# Patient Record
Sex: Female | Born: 1975 | Race: Black or African American | Hispanic: No | Marital: Single | Smoking: Current every day smoker
Health system: Southern US, Community
[De-identification: ages and names within clinical notes are randomized; demographics above are authoritative.]

## PROBLEM LIST (undated history)

## (undated) DIAGNOSIS — R0600 Dyspnea, unspecified: Secondary | ICD-10-CM

## (undated) DIAGNOSIS — R51 Headache: Principal | ICD-10-CM

## (undated) DIAGNOSIS — D649 Anemia, unspecified: Secondary | ICD-10-CM

## (undated) DIAGNOSIS — I499 Cardiac arrhythmia, unspecified: Secondary | ICD-10-CM

## (undated) DIAGNOSIS — M545 Low back pain, unspecified: Secondary | ICD-10-CM

## (undated) DIAGNOSIS — K589 Irritable bowel syndrome without diarrhea: Secondary | ICD-10-CM

## (undated) DIAGNOSIS — G43909 Migraine, unspecified, not intractable, without status migrainosus: Secondary | ICD-10-CM

## (undated) DIAGNOSIS — E7800 Pure hypercholesterolemia, unspecified: Secondary | ICD-10-CM

## (undated) DIAGNOSIS — F419 Anxiety disorder, unspecified: Secondary | ICD-10-CM

## (undated) DIAGNOSIS — K219 Gastro-esophageal reflux disease without esophagitis: Secondary | ICD-10-CM

## (undated) DIAGNOSIS — E039 Hypothyroidism, unspecified: Secondary | ICD-10-CM

## (undated) DIAGNOSIS — M4800 Spinal stenosis, site unspecified: Secondary | ICD-10-CM

## (undated) HISTORY — PX: ABDOMINAL HYSTERECTOMY: SHX81

## (undated) HISTORY — DX: Low back pain: M54.5

## (undated) HISTORY — DX: Anemia, unspecified: D64.9

## (undated) HISTORY — PX: DILATION AND CURETTAGE OF UTERUS: SHX78

## (undated) HISTORY — DX: Migraine, unspecified, not intractable, without status migrainosus: G43.909

## (undated) HISTORY — DX: Spinal stenosis, site unspecified: M48.00

## (undated) HISTORY — DX: Low back pain, unspecified: M54.50

## (undated) HISTORY — DX: Pure hypercholesterolemia, unspecified: E78.00

## (undated) HISTORY — PX: OTHER SURGICAL HISTORY: SHX169

## (undated) HISTORY — PX: HERNIA REPAIR: SHX51

## (undated) HISTORY — DX: Headache: R51

---

## 2004-06-19 HISTORY — PX: CYSTECTOMY: SUR359

## 2004-06-19 HISTORY — PX: PARTIAL HYSTERECTOMY: SHX80

## 2008-06-19 HISTORY — PX: ORIF ANKLE FRACTURE BIMALLEOLAR: SUR920

## 2015-12-13 ENCOUNTER — Encounter: Payer: Self-pay | Admitting: Neurology

## 2015-12-13 ENCOUNTER — Ambulatory Visit (INDEPENDENT_AMBULATORY_CARE_PROVIDER_SITE_OTHER): Payer: BLUE CROSS/BLUE SHIELD | Admitting: Neurology

## 2015-12-13 VITALS — BP 108/82 | HR 60 | Ht 67.0 in | Wt 180.0 lb

## 2015-12-13 DIAGNOSIS — R51 Headache: Secondary | ICD-10-CM

## 2015-12-13 DIAGNOSIS — G4486 Cervicogenic headache: Secondary | ICD-10-CM

## 2015-12-13 HISTORY — DX: Cervicogenic headache: G44.86

## 2015-12-13 MED ORDER — GABAPENTIN 100 MG PO CAPS
ORAL_CAPSULE | ORAL | Status: DC
Start: 2015-12-13 — End: 2016-01-11

## 2015-12-13 NOTE — Progress Notes (Signed)
Reason for visit: Headache  Referring physician: Dr. Leone Haven is a 40 y.o. female  History of present illness:  Cindy Lamb is a 40 year old right-handed black female with a history of onset of daily headaches that began in the beginning of May 2017. The patient had spontaneous onset of symptoms. Prior to the onset of the headache, the patient was having only an occasional headache every 2 months or so. The patient indicates that the headaches are primarily on the left side, but the patient has significant neck discomfort and pain into the intrascapular area on the left and some occasional numbness down the left arm to the elbow. The patient indicates that she may get increased numbness down the left arm if she turns her head to the left. The pain is worse when she turns her head to the left rather than turning to the right. She may have some occasional sharp pains on the right side the head. The headaches on the left are dull achy and present at all times, better in the morning and worse as the day goes on. The patient reports no weakness of the extremities, or difficulty with balance. The patient has had some slight change in bladder function in the last couple months with increased urgency and frequency. The patient has been set up for physical therapy, this has not yet been done. The patient was placed on a prednisone Dosepak with some benefit. The patient has not had MRI evaluation of the brain or cervical spine. She is sent to this office for an evaluation. She has been placed on soma, but she can only take 1 tablet daily because of the drowsy effects of the medication.  Past Medical History  Diagnosis Date  . Low back pain     2 bulging discs  . Anemia   . Migraines   . High cholesterol   . Spinal stenosis     Mild  . Depression   . Cervicogenic headache 12/13/2015    Past Surgical History  Procedure Laterality Date  . Partial hysterectomy  2006  . Orif ankle  fracture bimalleolar  2010  . Cystectomy  2006    Family History  Problem Relation Age of Onset  . Prostate cancer Maternal Grandfather   . Hypertension Maternal Grandfather   . Hypertension Maternal Grandmother   . High Cholesterol Maternal Grandfather   . High Cholesterol Maternal Grandmother     Social history:  reports that she has been smoking Cigarettes.  She has been smoking about 0.50 packs per day. She does not have any smokeless tobacco history on file. She reports that she drinks alcohol. She reports that she uses illicit drugs.  Medications:  Prior to Admission medications   Medication Sig Start Date End Date Taking? Authorizing Provider  acetaminophen (TYLENOL) 500 MG tablet Take 500 mg by mouth every 6 (six) hours as needed.   Yes Historical Provider, MD  carisoprodol (SOMA) 350 MG tablet Take 350 mg by mouth 3 times/day as needed-between meals & bedtime for muscle spasms. Reported on 12/13/2015    Historical Provider, MD  Cholecalciferol (VITAMIN D PO) Take by mouth daily.   Yes Historical Provider, MD  fluticasone (FLONASE) 50 MCG/ACT nasal spray Place into both nostrils daily as needed for allergies or rhinitis.   Yes Historical Provider, MD  ibuprofen (ADVIL,MOTRIN) 200 MG tablet Take 200 mg by mouth every 4 (four) hours as needed.   Yes Historical Provider, MD  Multiple  Vitamin (MULTIVITAMIN) tablet Take 1 tablet by mouth daily.   Yes Historical Provider, MD  valACYclovir (VALTREX) 500 MG tablet Take 500 mg by mouth as needed.   Yes Historical Provider, MD  varenicline (CHANTIX) 1 MG tablet Take 1 mg by mouth 2 (two) times daily.   Yes Historical Provider, MD     No Known Allergies  ROS:  Out of a complete 14 system review of symptoms, the patient complains only of the following symptoms, and all other reviewed systems are negative.  Chills, fatigue Itching Cough, snoring Easy bruising Increased thirst Joint pain, muscle cramps, aching  muscles Allergies Headache, numbness Nonenhanced sleep insomnia, sleepiness, snoring, leg jerking   Blood pressure 108/82, pulse 60, height 5\' 7"  (1.702 m), weight 180 lb (81.647 kg).  Physical Exam  General: The patient is alert and cooperative at the time of the examination.  Eyes: Pupils are equal, round, and reactive to light. Discs are flat bilaterally.  Neck: The neck is supple, no carotid bruits are noted.  Respiratory: The respiratory examination is clear.  Cardiovascular: The cardiovascular examination reveals a regular rate and rhythm, no obvious murmurs or rubs are noted.  Neuromuscular: The patient lacks about 25 of lateral rotation of the cervical spine to the left, 20 to the right.  Skin: Extremities are without significant edema.  Neurologic Exam  Mental status: The patient is alert and oriented x 3 at the time of the examination. The patient has apparent normal recent and remote memory, with an apparently normal attention span and concentration ability.  Cranial nerves: Facial symmetry is present. There is good sensation of the face to pinprick and soft touch bilaterally. The strength of the facial muscles and the muscles to head turning and shoulder shrug are normal bilaterally. Speech is well enunciated, no aphasia or dysarthria is noted. Extraocular movements are full. Visual fields are full. The tongue is midline, and the patient has symmetric elevation of the soft palate. No obvious hearing deficits are noted.  Motor: The motor testing reveals 5 over 5 strength of all 4 extremities. Good symmetric motor tone is noted throughout.  Sensory: Sensory testing is intact to pinprick, soft touch, vibration sensation, and position sense on all 4 extremities. No evidence of extinction is noted.  Coordination: Cerebellar testing reveals good finger-nose-finger and heel-to-shin bilaterally.  Gait and station: Gait is normal. Tandem gait is normal. Romberg is negative.  No drift is seen.  Reflexes: Deep tendon reflexes are symmetric and normal bilaterally. Toes are downgoing bilaterally.   Assessment/Plan:  1. Probable cervicogenic headache   2. 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 The patient will be having some physical therapy, I will add low-dose gabapentin. If the patient is not improving over the next several weeks, she is to contact her office and we will get MRI evaluation of the cervical spine. The patient will otherwise follow-up in 2 months. She will call for dosage adjustments of the gabapentin if she is tolerating the medication.  Jill Alexanders MD 12/13/2015 8:44 PM  [facility] [address] [place], [place] [address]  Phone [phone] Fax [phone]

## 2015-12-13 NOTE — Patient Instructions (Signed)
   Neurontin (gabapentin) may result in drowsiness, ankle swelling, gait instability, or possibly dizziness. Please contact our office if significant side effects occur with this medication.  

## 2016-01-11 ENCOUNTER — Telehealth: Payer: Self-pay | Admitting: Neurology

## 2016-01-11 DIAGNOSIS — M542 Cervicalgia: Secondary | ICD-10-CM

## 2016-01-11 MED ORDER — GABAPENTIN 300 MG PO CAPS
ORAL_CAPSULE | ORAL | Status: DC
Start: 2016-01-11 — End: 2016-02-04

## 2016-01-11 NOTE — Telephone Encounter (Signed)
Patient is calling and states that she is tolerating the gabapentin fine and it is helping with her pain. Physical therapy is helping some with the pain but she does not have many sessions left and it costs her $50 per session. Please call.

## 2016-01-11 NOTE — Telephone Encounter (Signed)
Returned pt call. Reports that she has increased gabapentin to 300 mg TID which she's tolerating well. However, says that she's still having headaches and has even had to miss work recently d/t HA. Did attend 3 PT sessions which helped some w/ neck pain but she's unable to continue PT d/t cost. Would like Dr. Jannifer Franklin' recommendations for further headache management.

## 2016-01-11 NOTE — Telephone Encounter (Signed)
I called the patient. The patient is improving some physical therapy but this is too expensive. She is on gabapentin taking 300 mg 3 times daily, will increase the dosing to 300 mg twice during the day, 600 mg at night. I will get MRI study on the cervical spine, most of her discomfort is in the neck.

## 2016-02-04 ENCOUNTER — Telehealth: Payer: Self-pay | Admitting: Neurology

## 2016-02-04 MED ORDER — GABAPENTIN 300 MG PO CAPS: ORAL_CAPSULE | ORAL | 5 refills | Status: DC

## 2016-02-04 NOTE — Telephone Encounter (Signed)
Patient requesting refill of gabapentin (NEURONTIN) 300 MG capsule Pharmacy: [facility] [address] [place] [id number]

## 2016-02-04 NOTE — Telephone Encounter (Signed)
Refills retailed as requested. 

## 2016-02-14 ENCOUNTER — Ambulatory Visit: Payer: BLUE CROSS/BLUE SHIELD | Admitting: Adult Health

## 2016-02-22 ENCOUNTER — Ambulatory Visit (INDEPENDENT_AMBULATORY_CARE_PROVIDER_SITE_OTHER): Payer: BLUE CROSS/BLUE SHIELD | Admitting: Adult Health

## 2016-02-22 ENCOUNTER — Encounter: Payer: Self-pay | Admitting: Adult Health

## 2016-02-22 VITALS — BP 99/75 | HR 71 | Ht 67.0 in | Wt 184.2 lb

## 2016-02-22 DIAGNOSIS — R51 Headache: Secondary | ICD-10-CM

## 2016-02-22 DIAGNOSIS — G4486 Cervicogenic headache: Secondary | ICD-10-CM

## 2016-02-22 NOTE — Progress Notes (Signed)
PATIENT: Cindy Lamb DOB: 12-Aug-1975  REASON FOR VISIT: follow up- cervicogenic headaches HISTORY FROM: patient  HISTORY OF PRESENT ILLNESS: Cindy Lamb is a 40 year old female with a history of headaches and neck pain. She returns today for follow-up. She was started on gabapentin 300 mg in the morning and at noon and 600 mg in the evening. She also underwent physical therapy. She reports that gabapentin has been beneficial for her headaches. She states that she went from having daily headaches to having only 2 episodes a week. She also felt physical therapy beneficial but was unable to afford this. She was scheduled for an MRI of the cervical spine however she has not had this. She states that financially she cannot afford this at this time. She states her headaches typically occur in the back of the head. She states on Sunday she had a sharp pain in the neck that radiated to the back of the head up to the frontal region. She states that it lasted only several seconds. Denies nausea or vomiting, photophobia and phonophobia. She reports that she is tolerating gabapentin well. Denies any drowsiness. She is also been taking Soma however she's not had this refilled. She returns today for an evaluation.  HISTORY 12/13/15: Cindy Lamb is a 40 year old right-handed black female with a history of onset of daily headaches that began in the beginning of May 2017. The patient had spontaneous onset of symptoms. Prior to the onset of the headache, the patient was having only an occasional headache every 2 months or so. The patient indicates that the headaches are primarily on the left side, but the patient has significant neck discomfort and pain into the intrascapular area on the left and some occasional numbness down the left arm to the elbow. The patient indicates that she may get increased numbness down the left arm if she turns her head to the left. The pain is worse when she turns her head to the left rather  than turning to the right. She may have some occasional sharp pains on the right side the head. The headaches on the left are dull achy and present at all times, better in the morning and worse as the day goes on. The patient reports no weakness of the extremities, or difficulty with balance. The patient has had some slight change in bladder function in the last couple months with increased urgency and frequency. The patient has been set up for physical therapy, this has not yet been done. The patient was placed on a prednisone Dosepak with some benefit. The patient has not had MRI evaluation of the brain or cervical spine. She is sent to this office for an evaluation. She has been placed on soma, but she can only take 1 tablet daily because of the drowsy effects of the medication.   REVIEW OF SYSTEMS: Out of a complete 14 system review of symptoms, the patient complains only of the following symptoms, and all other reviewed systems are negative.  Frequency of urination, urgency, joint pain, joint swelling, back pain, aching muscles, muscle cramps, neck pain, insomnia, snoring, blurred vision, eye discharge, eye itching, eye redness, bruise/bleed easily, headache, numbness  ALLERGIES: No Known Allergies  HOME MEDICATIONS: Outpatient Medications Prior to Visit  Medication Sig Dispense Refill  . acetaminophen (TYLENOL) 500 MG tablet Take 500 mg by mouth every 6 (six) hours as needed.    . carisoprodol (SOMA) 350 MG tablet Take 350 mg by mouth 3 times/day as needed-between meals &  bedtime for muscle spasms. Reported on 12/13/2015    . Cholecalciferol (VITAMIN D PO) Take by mouth daily.    . fluticasone (FLONASE) 50 MCG/ACT nasal spray Place into both nostrils daily as needed for allergies or rhinitis.    Marland Kitchen gabapentin (NEURONTIN) 300 MG capsule Take 1 cap (300 mg) in the morning, 2 caps (600 mg) at night 90 capsule 5  . Multiple Vitamin (MULTIVITAMIN) tablet Take 1 tablet by mouth daily.    .  valACYclovir (VALTREX) 500 MG tablet Take 500 mg by mouth as needed.    Marland Kitchen ibuprofen (ADVIL,MOTRIN) 200 MG tablet Take 200 mg by mouth every 4 (four) hours as needed.    . varenicline (CHANTIX) 1 MG tablet Take 1 mg by mouth 2 (two) times daily.     No facility-administered medications prior to visit.     PAST MEDICAL HISTORY: Past Medical History:  Diagnosis Date  . Anemia   . 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 Depression   . High cholesterol   . Low back pain    2 bulging discs  . Migraines   . Spinal stenosis    Mild    PAST SURGICAL HISTORY: Past Surgical History:  Procedure Laterality Date  . CYSTECTOMY  2006  . ORIF ANKLE FRACTURE BIMALLEOLAR  2010  . PARTIAL HYSTERECTOMY  2006    FAMILY HISTORY: Family History  Problem Relation Age of Onset  . Prostate cancer Maternal Grandfather   . Hypertension Maternal Grandfather   . Hypertension Maternal Grandmother   . High Cholesterol Maternal Grandfather   . High Cholesterol Maternal Grandmother     SOCIAL HISTORY: Social History   Social History  . Marital status: Single    Spouse name: N/A  . Number of children: 2  . Years of education: College   Occupational History  .      [employer] History Main Topics  . Smoking status: Current Every Day Smoker    Packs/day: 0.50    Types: Cigarettes  . Smokeless tobacco: Not on file  . Alcohol use 0.0 oz/week     Comment: 0-5 drinks per wk  . Drug use:      Comment: occ marijuana  . Sexual activity: Not on file   Other Topics Concern  . Not on file   Social History Narrative   Lives at home w/ her son   Right-handed   Occasional caffeine      PHYSICAL EXAM  Vitals:   02/22/16 0748  BP: 99/75  Pulse: 71  Weight: 184 lb 3.2 oz (83.6 kg)  Height: 5\' 7"  (1.702 m)   Body mass index is 28.85 kg/m.  Generalized: Well developed, in no acute distress   Neurological examination  Mentation: Alert oriented to time, place,  history taking. Follows all commands speech and language fluent Cranial nerve II-XII: Pupils were equal round reactive to light. Extraocular movements were full, visual field were full on confrontational test. Facial sensation and strength were normal. Uvula tongue midline. Head turning and shoulder shrug  were normal and symmetric. Motor: The motor testing reveals 5 over 5 strength of all 4 extremities. Good symmetric motor tone is noted throughout.  Sensory: Sensory testing is intact to soft touch on all 4 extremities. No evidence of extinction is noted.  Coordination: Cerebellar testing reveals good finger-nose-finger and heel-to-shin bilaterally.  Gait and station: Gait is normal. Tandem gait is normal. Romberg is negative. No drift is seen.  Reflexes: Deep tendon  reflexes are symmetric and normal bilaterally.   DIAGNOSTIC DATA (LABS, IMAGING, TESTING) - I reviewed patient records, labs, notes, testing and imaging myself where available.     ASSESSMENT AND PLAN 40 y.o. year old female  has a past medical history of Anemia; Cervicogenic headache (12/13/2015); Depression; High cholesterol; Low back pain; Migraines; and Spinal stenosis. here with:  1. Cervicogenic headaches  The patient's headaches have improved with physical therapy and gabapentin. She is now only having 2 episodes a week. I advised patient that she could try increasing gabapentin to 600 mg in the morning, 300 mg at noon and 600 mg in the evening. However I did warn him that increasing her morning dose may cause drowsiness. The patient states that she will try this first on a weekend and if she is unable to tolerate it she will resume her current dose. However she finds it beneficial she will let us know and we will change her prescription. She verbalized understanding. She will follow-up in 3 months or sooner if needed.   Ward Givens, MSN, NP-C 02/22/2016, 8:42 AM [facility] [address] [place], [place] [address] [phone]

## 2016-02-22 NOTE — Progress Notes (Signed)
I reviewed note and agree with plan.   Penni Bombard, MD [id number], 0000000 AM Certified in Neurology, Neurophysiology and Neuroimaging  [facility] [address], [place], [place] [address] [phone]

## 2016-02-22 NOTE — Patient Instructions (Signed)
Continue Gabapentin- can increase to 2 tablets in the morning, 1 tablet at noon and 2 tablets at night. Increasing the morning dose may cause drowsiness. If this is helpful we can call in a new prescription

## 2016-05-23 ENCOUNTER — Ambulatory Visit: Payer: BLUE CROSS/BLUE SHIELD | Admitting: Adult Health

## 2016-05-24 ENCOUNTER — Encounter: Payer: Self-pay | Admitting: Adult Health

## 2016-10-10 ENCOUNTER — Ambulatory Visit (HOSPITAL_COMMUNITY)
Admission: EM | Admit: 2016-10-10 | Discharge: 2016-10-10 | Disposition: A | Discharge status: Home / Self Care | Payer: BLUE CROSS/BLUE SHIELD | Attending: Internal Medicine | Admitting: Internal Medicine

## 2016-10-10 ENCOUNTER — Encounter (HOSPITAL_COMMUNITY): Payer: Self-pay | Admitting: Emergency Medicine

## 2016-10-10 DIAGNOSIS — G44209 Tension-type headache, unspecified, not intractable: Secondary | ICD-10-CM | POA: Diagnosis not present

## 2016-10-10 DIAGNOSIS — R110 Nausea: Secondary | ICD-10-CM | POA: Diagnosis not present

## 2016-10-10 HISTORY — DX: Irritable bowel syndrome, unspecified: K58.9

## 2016-10-10 MED ORDER — ONDANSETRON 4 MG PO TBDP
8.0000 mg | ORAL_TABLET | Freq: Once | ORAL | Status: AC
  Administered 2016-10-10: 8 mg via ORAL

## 2016-10-10 MED ORDER — ONDANSETRON 4 MG PO TBDP
ORAL_TABLET | ORAL | Status: AC
  Filled 2016-10-10: qty 2

## 2016-10-10 MED ORDER — DEXAMETHASONE SODIUM PHOSPHATE 10 MG/ML IJ SOLN
INTRAMUSCULAR | Status: AC
  Filled 2016-10-10: qty 1

## 2016-10-10 MED ORDER — KETOROLAC TROMETHAMINE 60 MG/2ML IM SOLN
60.0000 mg | Freq: Once | INTRAMUSCULAR | Status: AC
  Administered 2016-10-10: 60 mg via INTRAMUSCULAR

## 2016-10-10 MED ORDER — DEXAMETHASONE SODIUM PHOSPHATE 10 MG/ML IJ SOLN
10.0000 mg | Freq: Once | INTRAMUSCULAR | Status: AC
  Administered 2016-10-10: 10 mg via INTRAMUSCULAR

## 2016-10-10 MED ORDER — KETOROLAC TROMETHAMINE 60 MG/2ML IM SOLN
INTRAMUSCULAR | Status: AC
  Filled 2016-10-10: qty 2

## 2016-10-10 NOTE — ED Triage Notes (Addendum)
Pt has been suffering from right sided headaches that only last a few minutes for about two weeks.  She states she first noticed a "swooshing" sound in her right ear a few days before that.  She denies any pain, fullness, or hearing loss in the right ear.  She does report a knot behind the ear and in her right neck.  She also reports generalized body aches that started this weekend.  She is not aware of any fever.    Bilateral feet swelling, more in the right than the left.

## 2016-10-10 NOTE — Discharge Instructions (Signed)
We have treated your headache here today with Toradol, Decadron, and Zofran. Based on your signs and symptoms, I think it would be wise for your to call your neurologist and schedule a follow-up appointment as soon as possible. If it any time your symptoms worsen, in anyway, go to the emergency room as soon as possible for further evaluation.

## 2016-10-10 NOTE — ED Provider Notes (Signed)
CSN: [id number]     Arrival date & time 10/10/16  1219 History   First MD Initiated Contact with Patient 10/10/16 1321     Chief Complaint  Patient presents with  . Headache    right side   (Consider location/radiation/quality/duration/timing/severity/associated sxs/prior Treatment) 41 year old female presents to clinic for evaluation of a right-sided headache that is been ongoing for 2 weeks. She states the headache is been in intermittent waxing and waning, states the highest spin is 9 out of 10 currently is 4 out of 10. She has taken seat a medicine without relief. She does have a past history of migraines, however states these headaches are different than her past headaches. She denies a "thunderclap" denies fever, neck stiffness or neck pain, has no focal weakness, or other neurological symptoms.   The history is provided by the patient.  Headache  Pain location:  R temporal Quality:  Sharp Radiates to:  Does not radiate Severity currently:  4/10 Severity at highest:  9/10 Onset quality:  Gradual Duration:  2 weeks Timing:  Intermittent Progression:  Waxing and waning Chronicity:  New Similar to prior headaches: no   Context: not activity, not exposure to bright light, not caffeine, not eating, not stress, not loud noise and not straining   Relieved by:  Nothing Worsened by:  Activity Ineffective treatments:  Acetaminophen Associated symptoms: nausea   Associated symptoms: no abdominal pain, no blurred vision, no congestion, no cough, no dizziness, no ear pain, no eye pain, no facial pain, no fever, no focal weakness, no hearing loss, no loss of balance, no myalgias, no neck pain, no neck stiffness, no numbness, no paresthesias, no photophobia, no seizures, no sinus pressure, no syncope, no tingling, no visual change and no vomiting     Past Medical History:  Diagnosis Date  . Anemia   . 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 Depression   . High cholesterol   . IBS (irritable  bowel syndrome)   . Low back pain    2 bulging discs  . Migraines   . Spinal stenosis    Mild   Past Surgical History:  Procedure Laterality Date  . CYSTECTOMY  2006  . ORIF ANKLE FRACTURE BIMALLEOLAR  2010  . PARTIAL HYSTERECTOMY  2006   Family History  Problem Relation Age of Onset  . Prostate cancer Maternal Grandfather   . Hypertension Maternal Grandfather   . High Cholesterol Maternal Grandfather   . Hypertension Maternal Grandmother   . High Cholesterol Maternal Grandmother    Social History  Substance Use Topics  . Smoking status: Current Every Day Smoker    Packs/day: 0.25    Types: Cigarettes  . Smokeless tobacco: Never Used  . Alcohol use No     Comment: 0-5 drinks per wk   OB History    No data available     Review of Systems  Constitutional: Negative for appetite change, chills and fever.  HENT: Negative for congestion, ear pain, hearing loss and sinus pressure.   Eyes: Negative for blurred vision, photophobia and pain.  Respiratory: Negative for cough and wheezing.   Cardiovascular: Negative for chest pain, palpitations and syncope.  Gastrointestinal: Positive for nausea. Negative for abdominal pain and vomiting.  Genitourinary: Negative.   Musculoskeletal: Negative for myalgias, neck pain and neck stiffness.  Skin: Negative.   Neurological: Positive for headaches. Negative for dizziness, focal weakness, seizures, numbness, paresthesias and loss of balance.    Allergies  Patient has no known allergies.  Home  Medications   Prior to Admission medications   Medication Sig Start Date End Date Taking? Authorizing Provider  loperamide (IMODIUM) 2 MG capsule Take by mouth as needed for diarrhea or loose stools.   Yes Historical Provider, MD  ranitidine (ZANTAC) 150 MG tablet Take 150 mg by mouth 2 (two) times daily.   Yes Historical Provider, MD  Vitamin D, Ergocalciferol, (DRISDOL) 50000 units CAPS capsule Take 50,000 Units by mouth every 7 (seven) days.    Yes Historical Provider, MD  acetaminophen (TYLENOL) 500 MG tablet Take 500 mg by mouth every 6 (six) hours as needed.    Historical Provider, MD  carisoprodol (SOMA) 350 MG tablet Take 350 mg by mouth 3 times/day as needed-between meals & bedtime for muscle spasms. Reported on 12/13/2015    Historical Provider, MD  Cholecalciferol (VITAMIN D PO) Take by mouth daily.    Historical Provider, MD  fluticasone (FLONASE) 50 MCG/ACT nasal spray Place into both nostrils daily as needed for allergies or rhinitis.    Historical Provider, MD  gabapentin (NEURONTIN) 300 MG capsule Take 1 cap (300 mg) in the morning, 2 caps (600 mg) at night 02/04/16   Kathrynn Ducking, MD  Multiple Vitamin (MULTIVITAMIN) tablet Take 1 tablet by mouth daily.    Historical Provider, MD  valACYclovir (VALTREX) 500 MG tablet Take 500 mg by mouth as needed.    Historical Provider, MD   Meds Ordered and Administered this Visit   Medications  ketorolac (TORADOL) injection 60 mg (60 mg Intramuscular Given 10/10/16 1349)  dexamethasone (DECADRON) injection 10 mg (10 mg Intramuscular Given 10/10/16 1352)  ondansetron (ZOFRAN-ODT) disintegrating tablet 8 mg (8 mg Oral Given 10/10/16 1348)    BP 129/71 (BP Location: Right Arm)   Pulse 72   Temp 99.2 F (37.3 C) (Oral)   SpO2 98%  No data found.   Physical Exam  Constitutional: She is oriented to person, place, and time. She appears well-developed and well-nourished. No distress.  HENT:  Head: Normocephalic and atraumatic.  Right Ear: External ear normal.  Left Ear: External ear normal.  Nose: Nose normal.  Mouth/Throat: Oropharynx is clear and moist.  Eyes: Conjunctivae are normal. Pupils are equal, round, and reactive to light. Right eye exhibits no discharge. Left eye exhibits no discharge.  Neck: Normal range of motion. Neck supple. No JVD present.  Cardiovascular: Normal rate and regular rhythm.   Pulmonary/Chest: Effort normal and breath sounds normal.   Lymphadenopathy:    She has no cervical adenopathy.  Neurological: She is alert and oriented to person, place, and time. No cranial nerve deficit or sensory deficit. She exhibits normal muscle tone. Coordination normal.  Skin: Skin is warm and dry. Capillary refill takes less than 2 seconds. She is not diaphoretic. No erythema.  Psychiatric: She has a normal mood and affect. Her behavior is normal.  Nursing note and vitals reviewed.   Urgent Care Course     Procedures (including critical care time)  Labs Review Labs Reviewed - No data to display  Imaging Review No results found.     MDM   1. Acute non intractable tension-type headache     Given Toradol, dexamethasone, Zofran in clinic. Patient has a neurologist, strongly recommend following up with the neurologist for further evaluation of her symptoms since these are different than past headaches and given her age of 31. Strict follow-up guidelines were given to go to the emergency room at any point her headache worsens, or if she has any  neurological deficits.    Barnet Glasgow, NP 10/10/16 [phone]

## 2016-10-12 ENCOUNTER — Encounter: Payer: Self-pay | Admitting: Adult Health

## 2016-10-12 ENCOUNTER — Ambulatory Visit (INDEPENDENT_AMBULATORY_CARE_PROVIDER_SITE_OTHER): Payer: BLUE CROSS/BLUE SHIELD | Admitting: Adult Health

## 2016-10-12 VITALS — BP 121/81 | HR 53 | Resp 16 | Ht 67.0 in | Wt 173.5 lb

## 2016-10-12 DIAGNOSIS — R42 Dizziness and giddiness: Secondary | ICD-10-CM

## 2016-10-12 DIAGNOSIS — R51 Headache with orthostatic component, not elsewhere classified: Secondary | ICD-10-CM

## 2016-10-12 DIAGNOSIS — G43009 Migraine without aura, not intractable, without status migrainosus: Secondary | ICD-10-CM | POA: Diagnosis not present

## 2016-10-12 DIAGNOSIS — H93A1 Pulsatile tinnitus, right ear: Secondary | ICD-10-CM | POA: Diagnosis not present

## 2016-10-12 NOTE — Progress Notes (Addendum)
PATIENT: Cindy Lamb DOB: [birth year]  REASON FOR VISIT: follow up- cervicogenic headache HISTORY FROM: patient  HISTORY OF PRESENT ILLNESS: Cindy Lamb is a 41 year old female with a history of cervicogenic headaches. She reports that she was having increasing urinary frequency and drowsiness during the day therefore she stopped gabapentin. She states in December she had a sinus infection and was treated by her primary care with Toradol and Decadron. She states after this she did not have any additional headaches. She reports approximately 2 weeks ago she had a different type of headache. Reports that she was at work and began to have sharp stabbing pain on the right side of the head. She did have photophobia and phonophobia during the headache. Nausea and vomiting. She also had blurry vision.  Dizziness during headache. She states that these headaches lasted for approximately 2 weeks on and off all day long. She states that she has  had trouble with her concentration since the headaches. She also now has a whooshing sound in the right ear. This is more pronounced during the headache but is present even now that  her headache has resolved. She states that she did go to urgent care on Tuesday and was given Toradol and Decadron. Reports today her headache has resolved. She also reports that during these headaches she was having muscle aches and noticed that her right foot was swollen. She also noted that her headaches was worse when she laid on the right side. She returns today for an evaluation. HISTORY 02/22/16: Cindy Lamb is a 41 year old female with a history of headaches and neck pain. She returns today for follow-up. She was started on gabapentin 300 mg in the morning and at noon and 600 mg in the evening. She also underwent physical therapy. She reports that gabapentin has been beneficial for her headaches. She states that she went from having daily headaches to having only 2 episodes a week. She  also felt physical therapy beneficial but was unable to afford this. She was scheduled for an MRI of the cervical spine however she has not had this. She states that financially she cannot afford this at this time. She states her headaches typically occur in the back of the head. She states on Sunday she had a sharp pain in the neck that radiated to the back of the head up to the frontal region. She states that it lasted only several seconds. Denies nausea or vomiting, photophobia and phonophobia. She reports that she is tolerating gabapentin well. Denies any drowsiness. She is also been taking Soma however she's not had this refilled. She returns today for an evaluation.  REVIEW OF SYSTEMS: Out of a complete 14 system review of symptoms, the patient complains only of the following symptoms, and all other reviewed systems are negative.  Appetite change, chills, fatigue, unexpected weight change, excessive sweating, ringing in ears, cough, shortness of breath, leg swelling, blurred vision, eye discharge, eye itching, eye redness, cold intolerance, excessive thirst, abdominal pain, constipation diarrhea, nausea, vomiting, insomnia, frequent waking, daytime sleepiness, acting out dreams, rash, moles, neck pain, neck stiffness, aching muscles, muscle cramps, joint pain, back pain, joint swelling, environmental allergies, dizziness, headache, weakness, tremors, decreased concentration  ALLERGIES: No Known Allergies  HOME MEDICATIONS: Outpatient Medications Prior to Visit  Medication Sig Dispense Refill  . acetaminophen (TYLENOL) 500 MG tablet Take 500 mg by mouth every 6 (six) hours as needed.    . Cholecalciferol (VITAMIN D PO) Take by mouth daily.    Marland Kitchen  loperamide (IMODIUM) 2 MG capsule Take by mouth as needed for diarrhea or loose stools.    . Multiple Vitamin (MULTIVITAMIN) tablet Take 1 tablet by mouth daily.    . ranitidine (ZANTAC) 150 MG tablet Take 150 mg by mouth 2 (two) times daily.    .  valACYclovir (VALTREX) 500 MG tablet Take 500 mg by mouth as needed.    . carisoprodol (SOMA) 350 MG tablet Take 350 mg by mouth 3 times/day as needed-between meals & bedtime for muscle spasms. Reported on 12/13/2015    . fluticasone (FLONASE) 50 MCG/ACT nasal spray Place into both nostrils daily as needed for allergies or rhinitis.    Marland Kitchen gabapentin (NEURONTIN) 300 MG capsule Take 1 cap (300 mg) in the morning, 2 caps (600 mg) at night (Patient not taking: Reported on 10/12/2016) 90 capsule 5  . Vitamin D, Ergocalciferol, (DRISDOL) 50000 units CAPS capsule Take 50,000 Units by mouth every 7 (seven) days.     No facility-administered medications prior to visit.     PAST MEDICAL HISTORY: Past Medical History:  Diagnosis Date  . Anemia   . 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 Depression   . High cholesterol   . IBS (irritable bowel syndrome)   . Low back pain    2 bulging discs  . Migraines   . Spinal stenosis    Mild    PAST SURGICAL HISTORY: Past Surgical History:  Procedure Laterality Date  . CYSTECTOMY  2006  . ORIF ANKLE FRACTURE BIMALLEOLAR  2010  . PARTIAL HYSTERECTOMY  2006    FAMILY HISTORY: Family History  Problem Relation Age of Onset  . Prostate cancer Maternal Grandfather   . Hypertension Maternal Grandfather   . High Cholesterol Maternal Grandfather   . Hypertension Maternal Grandmother   . High Cholesterol Maternal Grandmother     SOCIAL HISTORY: Social History   Social History  . Marital status: Single    Spouse name: N/A  . Number of children: 2  . Years of education: College   Occupational History  .      [employer] History Main Topics  . Smoking status: Current Every Day Smoker    Packs/day: 0.25    Types: Cigarettes  . Smokeless tobacco: Never Used  . Alcohol use No     Comment: 0-5 drinks per wk  . Drug use: No     Comment: occ marijuana  . Sexual activity: Not on file   Other Topics Concern  . Not on file    Social History Narrative   Lives at home w/ her son   Right-handed   Occasional caffeine      PHYSICAL EXAM  Vitals:   10/12/16 0851  BP: 121/81  Pulse: (!) 53  Resp: 16  Weight: 173 lb 8 oz (78.7 kg)  Height: 5\' 7"  (1.702 m)   Body mass index is 27.17 kg/m.  Generalized: Well developed, in no acute distress  HEENT: Ear canals are clear bilaterally. Tympanic membranes intact. Cardiac: No carotid bruits noted.  Neurological examination  Mentation: Alert oriented to time, place, history taking. Follows all commands speech and language fluent Cranial nerve II-XII: Pupils were equal round reactive to light. Extraocular movements were full, visual field were full on confrontational test. Facial sensation and strength were normal. Uvula tongue midline. Head turning and shoulder shrug  were normal and symmetric. Motor: The motor testing reveals 5 over 5 strength of all 4 extremities. Good symmetric motor tone is  noted throughout.  Sensory: Sensory testing is intact to soft touch on all 4 extremities. No evidence of extinction is noted.  Coordination: Cerebellar testing reveals good finger-nose-finger and heel-to-shin bilaterally.  Gait and station: Gait is normal. Tandem gait is normal. Romberg is negative. No drift is seen.  Reflexes: Deep tendon reflexes are symmetric and normal bilaterally.   DIAGNOSTIC DATA (LABS, IMAGING, TESTING) - I reviewed patient records, labs, notes, testing and imaging myself where available.     ASSESSMENT AND PLAN 41 y.o. year old female  has a past medical history of Anemia; Cervicogenic headache (12/13/2015); Depression; High cholesterol; IBS (irritable bowel syndrome); Low back pain; Migraines; and Spinal stenosis. here with:  1. Migraine headache 2. Pulsatile tinnitus 3. Positional headache 4. Dizziness  The patient is reporting that her headache type has changed. She is now having what sounds like a migraine headache. However she is also   having pulsatile tinnitus which is concerning for possible vascular changes. She has also had changes in concentration, dizziness and headache is positional. She has never had an MRI of the brain. We will order this today to look for any lesions or schwannomas that may be contributing to the patient's headache and hearing changes. We'll also do an MRA of the brain to evaluate for pulsatile tinnitus. Patient is amenable to this plan. If her symptoms worsen or she develops new symptoms she should let us know. She will f/u in 3 months or sooner if needed.   Ward Givens, MSN, NP-C 10/12/2016, 9:03 AM [facility] [address], [place],  [address] [phone]

## 2016-10-12 NOTE — Progress Notes (Signed)
I have read the note, and I agree with the clinical assessment and plan.  Cindy Lamb,Cindy Lamb   

## 2016-10-12 NOTE — Patient Instructions (Addendum)
Monitor headaches. If they return let us know MRI/MRA brain

## 2016-10-13 ENCOUNTER — Telehealth: Payer: Self-pay | Admitting: Adult Health

## 2016-10-13 NOTE — Telephone Encounter (Signed)
[employer] approved the MR Brain w/wo contrast but not the MRA Head w/wo contrast.. They are both linked together so therefore I am not able to have the authorization on the MRI brain. The phone number for the peer to peer is [phone] and the member ID is [id number] & DOB is 09/28/1975. I used the diagnosis code for the Migraine one for the MRI Brain.. The MRA head I used the Pulsatile tinintus of the right ear.. The case does close on Tuesday 10/17/16.

## 2016-10-16 NOTE — Telephone Encounter (Signed)
Both scans have been approved. Confirmation number is [id number]. Approved 4/27-5/26

## 2016-10-16 NOTE — Telephone Encounter (Signed)
Noted, thank you

## 2016-10-17 NOTE — Telephone Encounter (Signed)
I spoke with the patient I informed her that her ded is 5,000.00 and she hasn't met any of that. The exam would be about $3,054.48.Marland Kitchen She is aware of this and wants to think about it because that is really high for her.. She sated she would give me a call back and tell me what she would like to do.. I did offer her our 12 month interest free payment plan.Marland Kitchen

## 2016-10-19 ENCOUNTER — Telehealth: Payer: Self-pay | Admitting: Adult Health

## 2016-10-19 DIAGNOSIS — H93A9 Pulsatile tinnitus, unspecified ear: Secondary | ICD-10-CM

## 2016-10-19 DIAGNOSIS — G4485 Primary stabbing headache: Secondary | ICD-10-CM

## 2016-10-19 MED ORDER — NORTRIPTYLINE HCL 10 MG PO CAPS: ORAL_CAPSULE | ORAL | 5 refills | Status: DC

## 2016-10-19 NOTE — Telephone Encounter (Signed)
Patient called office in reference to MRI patient would like to know if there are any other options due to the cost of MRI being over $3,000.  Also patient states she continues to have headaches.  Please call

## 2016-10-19 NOTE — Telephone Encounter (Signed)
I called the patient. She is unable to afford the MRI of the brain and MRA of the brain. She continues to have sharp stabbing headaches that occur throughout the day but only lasts 3-5 minutes. Denies photophobia, phonophobia, nausea or vomiting. The stabbing pain can occur anywhere throughout the head. I discussed with Dr. Jannifer Franklin. We will order a CT scan of the brain. We will also place the patient on nortriptyline 10 mg at bedtime for the first week then increasing to 20 mg for the second week and then 30 mg thereafter. She has been on amitriptyline in the past and is able to tolerate the medication well.

## 2016-10-19 NOTE — Telephone Encounter (Signed)
Looks like from previous notes MRI cost is too much (payment plan was offered).

## 2016-10-19 NOTE — Addendum Note (Signed)
Addended by: Trudie Buckler on: 10/19/2016 06:39 PM   Modules accepted: Orders

## 2016-10-19 NOTE — Telephone Encounter (Signed)
I called patient. Unable to leave message. VM full.

## 2016-10-19 NOTE — Addendum Note (Signed)
Addended by: Trudie Buckler on: 10/19/2016 04:13 PM   Modules accepted: Orders

## 2016-11-09 ENCOUNTER — Ambulatory Visit (HOSPITAL_COMMUNITY)
Admission: EM | Admit: 2016-11-09 | Discharge: 2016-11-09 | Disposition: A | Discharge status: Home / Self Care | Payer: BLUE CROSS/BLUE SHIELD | Attending: Family Medicine | Admitting: Family Medicine

## 2016-11-09 ENCOUNTER — Encounter (HOSPITAL_COMMUNITY): Payer: Self-pay | Admitting: Emergency Medicine

## 2016-11-09 DIAGNOSIS — G4459 Other complicated headache syndrome: Secondary | ICD-10-CM

## 2016-11-09 DIAGNOSIS — K625 Hemorrhage of anus and rectum: Secondary | ICD-10-CM

## 2016-11-09 LAB — OCCULT BLOOD, POC DEVICE: Fecal Occult Bld: NEGATIVE

## 2016-11-09 MED ORDER — DEXAMETHASONE SODIUM PHOSPHATE 10 MG/ML IJ SOLN
INTRAMUSCULAR | Status: AC
  Filled 2016-11-09: qty 1

## 2016-11-09 MED ORDER — ONDANSETRON HCL 4 MG/2ML IJ SOLN
4.0000 mg | Freq: Once | INTRAMUSCULAR | Status: AC
  Administered 2016-11-09: 4 mg via INTRAMUSCULAR

## 2016-11-09 MED ORDER — KETOROLAC TROMETHAMINE 30 MG/ML IJ SOLN
30.0000 mg | Freq: Once | INTRAMUSCULAR | Status: AC
  Administered 2016-11-09: 30 mg via INTRAMUSCULAR

## 2016-11-09 MED ORDER — DEXAMETHASONE SODIUM PHOSPHATE 10 MG/ML IJ SOLN
10.0000 mg | Freq: Once | INTRAMUSCULAR | Status: AC
  Administered 2016-11-09: 10 mg via INTRAMUSCULAR

## 2016-11-09 MED ORDER — ONDANSETRON HCL 4 MG/2ML IJ SOLN
INTRAMUSCULAR | Status: AC
  Filled 2016-11-09: qty 2

## 2016-11-09 MED ORDER — KETOROLAC TROMETHAMINE 30 MG/ML IJ SOLN
INTRAMUSCULAR | Status: AC
  Filled 2016-11-09: qty 1

## 2016-11-09 NOTE — ED Triage Notes (Addendum)
Pt was seen here a month ago for right sided headaches that only last a few minutes at a time.  Pt reports that these headache are all over her head now and becoming more frequent.  She states she has had 5 already since she woke up.  Pt was seen by a neurologist and is on Nortriptyline for the headaches.  Pt also reports blood in her stool that started today and some nausea.  She reports a history of IBS that has flared up continuously over the last month.

## 2016-11-09 NOTE — ED Provider Notes (Signed)
CSN: [id number]     Arrival date & time 11/09/16  1016 History   First MD Initiated Contact with Patient 11/09/16 1127     Chief Complaint  Patient presents with  . Headache  . Blood In Stools   (Consider location/radiation/quality/duration/timing/severity/associated sxs/prior Treatment) 41 year old female presents to urgent care with a chief complaint of seeing bright red blood in her stool this morning around 8:00. She was having a bowel movement at the time. She has a history of IBS and it alternates with diarrhea and constipation. She states it is not predominantly 1 over the other today or recently it is both in the same day. Denies rectal pain. She does not have a history of rectal bleeding. It occurred only once this morning. Denies abdominal pain although she does have tenderness across the lower abdomen and right lower quadrant.  Second complaint is that of a stabbing headache, viselike which is generalized. She has been having frequent headaches over the past month or so that have been somewhat different from previous headaches. She has a history of cervicogenic headache as well as migraines. She is referring to these headaches as migraines that occur frequently then get better minutes at a time. She states she had 2 similar headaches while in the waiting room. There is no change in this particular headache compared to one she has had in the past 2 or more weeks. No new symptoms. She continues to have nausea and has vomited once today. She has taken Zofran. That did not relieve her nausea. Other associated symptoms include transient dizziness, her balance is off on occasion. Denies acute problems with vision, speech, hearing, swallowing or focal paresthesias or weakness. No problems with speech, concentration, recall today.  Very recently she has saw her neurologist. A new development with her headache is a pulsatile tinnitus in which she has arranged to have MRI, MRA of the head and neck  tomorrow. She will be following up with her neurologist after that. She states she is not hear some much for her headache as she is her concern for seeing small amount of blood in her stool this morning.  The chart from her neurology visit and her most recent visit in the urgent care was reviewed.       Past Medical History:  Diagnosis Date  . Anemia   . 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 Depression   . High cholesterol   . IBS (irritable bowel syndrome)   . Low back pain    2 bulging discs  . Migraines   . Spinal stenosis    Mild   Past Surgical History:  Procedure Laterality Date  . CYSTECTOMY  2006  . ORIF ANKLE FRACTURE BIMALLEOLAR  2010  . PARTIAL HYSTERECTOMY  2006   Family History  Problem Relation Age of Onset  . Prostate cancer Maternal Grandfather   . Hypertension Maternal Grandfather   . High Cholesterol Maternal Grandfather   . Hypertension Maternal Grandmother   . High Cholesterol Maternal Grandmother    Social History  Substance Use Topics  . Smoking status: Current Every Day Smoker    Packs/day: 0.25    Types: Cigarettes  . Smokeless tobacco: Never Used  . Alcohol use No     Comment: 0-5 drinks per wk   OB History    No data available     Review of Systems  Constitutional: Positive for activity change. Negative for fever.  HENT: Negative.   Eyes: Positive for photophobia. Negative for pain.  Respiratory: Negative.   Cardiovascular: Negative.   Gastrointestinal: Positive for constipation, diarrhea, nausea and vomiting. Negative for abdominal pain.  Genitourinary: Negative.   Musculoskeletal: Negative.   Skin: Negative.   Neurological: Positive for dizziness and headaches. Negative for tremors, seizures, syncope, facial asymmetry, speech difficulty and numbness.  All other systems reviewed and are negative.   Allergies  Patient has no known allergies.  Home Medications   Prior to Admission medications   Medication Sig Start Date End  Date Taking? Authorizing Provider  acetaminophen (TYLENOL) 500 MG tablet Take 500 mg by mouth every 6 (six) hours as needed.   Yes [provider]  nortriptyline (PAMELOR) 10 MG capsule Take 1 tab PO at bedtime for 1 week, then 2 tabs at bedtime for 1 week, then 3 tabs at bedtime thereafter 10/19/16  Yes Ward Givens, NP  valACYclovir (VALTREX) 500 MG tablet Take 500 mg by mouth as needed.   Yes [provider]  Vitamin D, Ergocalciferol, (DRISDOL) 50000 units CAPS capsule Take 50,000 Units by mouth every 7 (seven) days.   Yes [provider]  carisoprodol (SOMA) 350 MG tablet Take 350 mg by mouth 3 times/day as needed-between meals & bedtime for muscle spasms. Reported on 12/13/2015    [provider]  Cholecalciferol (VITAMIN D PO) Take by mouth daily.    [provider]  fluticasone (FLONASE) 50 MCG/ACT nasal spray Place into both nostrils daily as needed for allergies or rhinitis.    [provider]  loperamide (IMODIUM) 2 MG capsule Take by mouth as needed for diarrhea or loose stools.    [provider]  Multiple Vitamin (MULTIVITAMIN) tablet Take 1 tablet by mouth daily.    [provider]  OVER THE COUNTER MEDICATION IB Guard--peppermint capsules for gi issues    [provider]  ranitidine (ZANTAC) 150 MG tablet Take 150 mg by mouth 2 (two) times daily.    [provider]  Wheat Dextrin (BENEFIBER PO) Take by mouth.    [provider]   Meds Ordered and Administered this Visit   Medications  ketorolac (TORADOL) 30 MG/ML injection 30 mg (not administered)  dexamethasone (DECADRON) injection 10 mg (not administered)  ondansetron (ZOFRAN) injection 4 mg (not administered)    BP 126/85 (BP Location: Right Arm)   Pulse 66   Temp 98.3 F (36.8 C) (Oral)   SpO2 100%  No data found.   Physical Exam  Constitutional: She is oriented to person, place, and time. She appears well-developed  and well-nourished. No distress.  HENT:  Head: Normocephalic and atraumatic.  Mouth/Throat: Oropharynx is clear and moist.  Eyes: Conjunctivae and EOM are normal. Pupils are equal, round, and reactive to light.  Neck: Normal range of motion. Neck supple.  Cardiovascular: Normal rate, regular rhythm, normal heart sounds and intact distal pulses.   Pulmonary/Chest: Breath sounds normal. She is in respiratory distress. She has no wheezes.  Abdominal: Soft. Bowel sounds are normal. She exhibits no mass.  Tenderness across the lower abdomen just inferior to the umbilicus. Greatest amount of tenderness in the right lower quadrant. No rebound or guarding. Palpation of the left lower quadrant produces discomfort in the right or quadrant. No peritoneal signs. Percussion reveals areas of tympany and dullness.  Genitourinary: Rectal exam shows guaiac negative stool.  Lymphadenopathy:    She has no cervical adenopathy.  Neurological: She is alert and oriented to person, place, and time. She has normal strength. She displays no tremor. No cranial  nerve deficit or sensory deficit. She exhibits normal muscle tone. Coordination and gait normal. GCS eye subscore is 4. GCS verbal subscore is 5. GCS motor subscore is 6.  Skin: Skin is warm and dry. Capillary refill takes less than 2 seconds.  Psychiatric: She has a normal mood and affect. Her behavior is normal. Judgment and thought content normal.  Nursing note and vitals reviewed.   Urgent Care Course     Procedures (including critical care time)  Labs Review Labs Reviewed  OCCULT BLOOD, POC DEVICE    Imaging Review No results found.   Visual Acuity Review  Right Eye Distance:   Left Eye Distance:   Bilateral Distance:    Right Eye Near:   Left Eye Near:    Bilateral Near:         MDM   1. Other complicated headache syndrome   2. Bright red rectal bleeding   Patient is alert and oriented and stable. Smiling at times. Completely  cooperative with exam. No abnormal findings on gross neurologic exam. She does have tenderness across the lower abdomen however no peritoneal signs or guarding. She has a long history of IBS and headaches as well as new icterus 6 for her headache and is undergoing evaluation by her neurologist. She appears generally well and stable in no acute distress. Be sure to keep her appointments for your imaging test tomorrow. This is very important. Call your neurologist tomorrow if you have not headache symptoms or are getting worse. On today's testing there was no blood found in your stool. No source for bleeding, no hemorrhoids or other lesions that would provide a reason for your bleeding. Follow-up with your gastroenterologist soon. If she continued to have bleeding you may need to go to the emergency department if she cannot see your gastroenterologist or primary care provider. Meds ordered this encounter  Medications  . ketorolac (TORADOL) 30 MG/ML injection 30 mg  . dexamethasone (DECADRON) injection 10 mg  . ondansetron (ZOFRAN) injection 4 mg       Janne Napoleon, NP 11/09/16 1223    Janne Napoleon, NP 11/09/16 1225

## 2016-11-09 NOTE — ED Notes (Signed)
Room set up for rectal exam

## 2016-11-09 NOTE — Discharge Instructions (Signed)
Be sure to keep her appointments for your imaging test tomorrow. This is very important. Call your neurologist tomorrow if you have not headache symptoms or are getting worse. On today's testing there was no blood found in your stool. No source for bleeding, no hemorrhoids or other lesions that would provide a reason for your bleeding. Follow-up with your gastroenterologist soon. If she continued to have bleeding you may need to go to the emergency department if she cannot see your gastroenterologist or primary care provider.

## 2016-11-10 ENCOUNTER — Ambulatory Visit
Admission: RE | Admit: 2016-11-10 | Discharge: 2016-11-10 | Disposition: A | Discharge status: Home / Self Care | Payer: BLUE CROSS/BLUE SHIELD | Source: Ambulatory Visit | Attending: Adult Health | Admitting: Adult Health

## 2016-11-10 DIAGNOSIS — H93A9 Pulsatile tinnitus, unspecified ear: Secondary | ICD-10-CM

## 2016-11-10 DIAGNOSIS — G4485 Primary stabbing headache: Secondary | ICD-10-CM

## 2016-11-10 MED ORDER — IOPAMIDOL (ISOVUE-300) INJECTION 61%
75.0000 mL | Freq: Once | INTRAVENOUS | Status: AC | PRN
  Administered 2016-11-10: 75 mL via INTRAVENOUS

## 2016-11-13 ENCOUNTER — Telehealth: Payer: Self-pay | Admitting: Neurology

## 2016-11-13 NOTE — Telephone Encounter (Signed)
This patients MRI brain study was ab-normal. 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 Please confer with attending physician. CD

## 2016-11-14 ENCOUNTER — Telehealth: Payer: Self-pay | Admitting: Adult Health

## 2016-11-14 DIAGNOSIS — R9089 Other abnormal findings on diagnostic imaging of central nervous system: Secondary | ICD-10-CM

## 2016-11-14 NOTE — Telephone Encounter (Signed)
Noted, I redid the authorization for a MRI brain w/wo contrast and I sent the order to GI.

## 2016-11-14 NOTE — Telephone Encounter (Signed)
Noted. Will discuss with Dr. Jannifer Franklin.

## 2016-11-14 NOTE — Telephone Encounter (Signed)
I called the patient. She did have an abnormal finding on her CT head. I discussed with Dr. Jannifer Franklin he recommended MRI of the brain with and without contrast. He also recommended blood work. I informed the patient and she is amenable to this plan. If possible she would like to have her MRI done in [place] [facility].  CT head:  1. 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 No  appreciable calcifications or soft tissue components. 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 Further characterization  with pituitary MRI with and without contrast is recommended.

## 2016-11-15 ENCOUNTER — Telehealth: Payer: Self-pay | Admitting: Neurology

## 2016-11-15 ENCOUNTER — Other Ambulatory Visit (INDEPENDENT_AMBULATORY_CARE_PROVIDER_SITE_OTHER): Payer: Self-pay

## 2016-11-15 DIAGNOSIS — R9089 Other abnormal findings on diagnostic imaging of central nervous system: Secondary | ICD-10-CM

## 2016-11-15 DIAGNOSIS — Z0289 Encounter for other administrative examinations: Secondary | ICD-10-CM

## 2016-11-15 NOTE — Telephone Encounter (Signed)
Patient is returning your call.  

## 2016-11-15 NOTE — Telephone Encounter (Signed)
Called pt and LMVM for her, trying to get more information about her headache.  She was seen at urgent care 11-09-16 for gi bleed.  Was given migraine cocktail.  (decadron, toradol and zofran).

## 2016-11-15 NOTE — Telephone Encounter (Signed)
LMVM for pt that returning her call.  

## 2016-11-15 NOTE — Telephone Encounter (Signed)
Pt would like to know if Cindy Lamb can call her in Zofran 4mg  and would also like something for her headaches like steroids or something that is not Tylenol.

## 2016-11-16 ENCOUNTER — Telehealth: Payer: Self-pay | Admitting: Adult Health

## 2016-11-16 DIAGNOSIS — R930 Abnormal findings on diagnostic imaging of skull and head, not elsewhere classified: Secondary | ICD-10-CM

## 2016-11-16 LAB — THYROID PANEL
Free Thyroxine Index: 0.7 — ABNORMAL LOW (ref 1.2–4.9)
T3 UPTAKE RATIO: 19 % — AB (ref 24–39)
T4, Total: 3.6 ug/dL — ABNORMAL LOW (ref 4.5–12.0)

## 2016-11-16 LAB — PROLACTIN: PROLACTIN: 65.6 ng/mL — AB (ref 4.8–23.3)

## 2016-11-16 LAB — LUTEINIZING HORMONE: LH: <0.2 m[IU]/mL

## 2016-11-16 LAB — FOLLICLE STIMULATING HORMONE: FSH: 1.7 m[IU]/mL

## 2016-11-16 LAB — GROWTH HORMONE: Growth Hormone: 0.1 ng/mL (ref 0.0–10.0)

## 2016-11-16 MED ORDER — ONDANSETRON HCL 4 MG PO TABS: 4.0000 mg | ORAL_TABLET | Freq: Three times a day (TID) | ORAL | 0 refills | Status: DC | PRN

## 2016-11-16 MED ORDER — GABAPENTIN 100 MG PO CAPS: 100.0000 mg | ORAL_CAPSULE | Freq: Three times a day (TID) | ORAL | 5 refills | Status: DC

## 2016-11-16 NOTE — Telephone Encounter (Signed)
I called the patient. I relayed here was some abnormal findings on her blood work. I would like to fax this to her primary care to review. She states that she is in the process of finding a primary care provider. States that she plans to call [facility] to schedule an appointment today. When she is established with a primary care we will forward lab work to them. The patient is scheduled for an MRI of the brain next weekend. The patient states that she has essentially daily headaches. She has a sharp shooting pain that comes on and last for approximately 5 minutes and occurs several times throughout the hour. She states that she also has headaches that feels as if a band is wrapped around her head. She is on nortriptyline taking 30 mg daily but has not noticed any benefit. She states this medication makes her very groggy. She states that the location of her headache varies. Reports that she does have blurred vision off and on throughout the day. She states that she feels thirsty all day long and urinates frequently. I will start the patient on gabapentin taken 100 mg twice a day for her headaches. zofran for nausea. She will slowly decreased nortriptyline by 1 tablet each week until this medication is discontinued. I will set the patient up with an appointment with ophthalmology for visual field testing. Once she has her MRI we will formulate a treatment plan.

## 2016-11-20 NOTE — Telephone Encounter (Signed)
Pt called back to provide her PCP info per Megan's request: PA Dorothy Scifres fax#[phone]  [facility] (on [address]) she has an appointment @ 10:00

## 2016-11-26 ENCOUNTER — Other Ambulatory Visit: Payer: BLUE CROSS/BLUE SHIELD

## 2016-11-26 ENCOUNTER — Ambulatory Visit
Admission: RE | Admit: 2016-11-26 | Discharge: 2016-11-26 | Disposition: A | Discharge status: Home / Self Care | Payer: BLUE CROSS/BLUE SHIELD | Source: Ambulatory Visit | Attending: Adult Health | Admitting: Adult Health

## 2016-11-26 DIAGNOSIS — H93A1 Pulsatile tinnitus, right ear: Secondary | ICD-10-CM | POA: Diagnosis not present

## 2016-11-26 DIAGNOSIS — R51 Headache with orthostatic component, not elsewhere classified: Secondary | ICD-10-CM

## 2016-11-26 DIAGNOSIS — G43009 Migraine without aura, not intractable, without status migrainosus: Secondary | ICD-10-CM

## 2016-11-26 DIAGNOSIS — R42 Dizziness and giddiness: Secondary | ICD-10-CM | POA: Diagnosis not present

## 2016-11-26 MED ORDER — GADOBENATE DIMEGLUMINE 529 MG/ML IV SOLN
15.0000 mL | Freq: Once | INTRAVENOUS | Status: AC | PRN
  Administered 2016-11-26: 15 mL via INTRAVENOUS

## 2016-11-27 ENCOUNTER — Telehealth: Payer: Self-pay | Admitting: Adult Health

## 2016-11-27 DIAGNOSIS — R220 Localized swelling, mass and lump, head: Secondary | ICD-10-CM

## 2016-11-27 DIAGNOSIS — G9389 Other specified disorders of brain: Secondary | ICD-10-CM

## 2016-11-27 DIAGNOSIS — R9089 Other abnormal findings on diagnostic imaging of central nervous system: Secondary | ICD-10-CM

## 2016-11-27 NOTE — Telephone Encounter (Signed)
I called the patient. I reviewed her MRI with her. Advised that I consulted with Dr. Jannifer Franklin he recommended a referral to neurosurgery. The patient has now been established with a primary care provider. She will needher records sent to: PA Dorothy Scifres [phone]  [facility]    MRI Brain:  There is a 171718 mm sella/suprasellar mass with peripheral enhancement. 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    A hypothalamic glioma is less likely.   As enhancement is peripheral and not homogenous, inflammatory etiologies (i.e hypophysitis, neurosarcoidosis) and pituitary macroadenoma are less likely.  Consider a thin section pituitary MRI to better evaluate.

## 2016-11-28 NOTE — Telephone Encounter (Signed)
Pt records faxed to Maude Leriche @ [phone].

## 2016-12-21 ENCOUNTER — Other Ambulatory Visit: Payer: Self-pay | Admitting: Physician Assistant

## 2016-12-21 DIAGNOSIS — R220 Localized swelling, mass and lump, head: Principal | ICD-10-CM

## 2016-12-21 DIAGNOSIS — G9389 Other specified disorders of brain: Secondary | ICD-10-CM

## 2016-12-26 ENCOUNTER — Telehealth: Payer: Self-pay | Admitting: Adult Health

## 2016-12-26 NOTE — Telephone Encounter (Signed)
Patient called office in reference to losing weight rapidly, always nauseous, and weak after seeing her PCP she is asking if Jinny Blossom is able to fill out intermittent FMLA paperwork.  Please call

## 2016-12-26 NOTE — Telephone Encounter (Signed)
Patient called office in reference to being referred to [facility] physician patient saw is requesting more labs, but they do not draw labs there.  Patient would like to know if she is able to have them drawn here or does she need to go to an outside draw station.  Please call

## 2016-12-26 NOTE — Telephone Encounter (Signed)
Spoke with patient who stated she continues to feel weak and has pain in her legs. That is what she is seeking FMLA for, so this RN advised her that her PCP will need to complete FMLA for those issues. Advised her that NP in this office is not treating her for those symptoms. Advised her that this office cannot draw labs for [facility], but that they will send her to the lab they use. This patient stated she did not want labs repeated that NP had done here. This RN advised she may access her labs on My Chart. Patient stated she would, verbalized understanding, appreciation of call.

## 2017-01-01 ENCOUNTER — Ambulatory Visit
Admission: RE | Admit: 2017-01-01 | Discharge: 2017-01-01 | Disposition: A | Discharge status: Home / Self Care | Payer: BLUE CROSS/BLUE SHIELD | Source: Ambulatory Visit | Attending: Physician Assistant | Admitting: Physician Assistant

## 2017-01-01 DIAGNOSIS — G9389 Other specified disorders of brain: Secondary | ICD-10-CM

## 2017-01-01 DIAGNOSIS — R220 Localized swelling, mass and lump, head: Principal | ICD-10-CM

## 2017-01-01 MED ORDER — GADOBENATE DIMEGLUMINE 529 MG/ML IV SOLN
7.0000 mL | Freq: Once | INTRAVENOUS | Status: AC | PRN
  Administered 2017-01-01: 7 mL via INTRAVENOUS

## 2017-01-11 ENCOUNTER — Ambulatory Visit: Payer: BLUE CROSS/BLUE SHIELD | Admitting: Adult Health

## 2017-03-08 ENCOUNTER — Encounter: Payer: Self-pay | Admitting: Endocrinology

## 2017-03-08 ENCOUNTER — Ambulatory Visit (INDEPENDENT_AMBULATORY_CARE_PROVIDER_SITE_OTHER): Payer: BLUE CROSS/BLUE SHIELD | Admitting: Endocrinology

## 2017-03-08 DIAGNOSIS — E230 Hypopituitarism: Secondary | ICD-10-CM | POA: Diagnosis not present

## 2017-03-08 LAB — BASIC METABOLIC PANEL
BUN: 7 mg/dL (ref 6–23)
CO2: 29 mEq/L (ref 19–32)
Calcium: 10 mg/dL (ref 8.4–10.5)
Chloride: 104 mEq/L (ref 96–112)
Creatinine, Ser: 1.01 mg/dL (ref 0.40–1.20)
GFR: 78.26 mL/min (ref >60.00)
GLUCOSE: 85 mg/dL (ref 70–99)
POTASSIUM: 3.6 meq/L (ref 3.5–5.1)
Sodium: 139 mEq/L (ref 135–145)

## 2017-03-08 LAB — URINALYSIS, ROUTINE W REFLEX MICROSCOPIC
BILIRUBIN URINE: NEGATIVE
Hgb urine dipstick: NEGATIVE
KETONES UR: NEGATIVE
LEUKOCYTES UA: NEGATIVE
NITRITE: NEGATIVE
RBC / HPF: NONE SEEN (ref 0–?)
Specific Gravity, Urine: <=1.005 — AB (ref 1.000–1.030)
TOTAL PROTEIN, URINE-UPE24: NEGATIVE
URINE GLUCOSE: NEGATIVE
UROBILINOGEN UA: 0.2 (ref 0.0–1.0)
WBC, UA: NONE SEEN (ref 0–?)
pH: 6 (ref 5.0–8.0)

## 2017-03-08 LAB — CORTISOL
Cortisol, Plasma: 10.4 ug/dL
Cortisol, Plasma: 3.5 ug/dL

## 2017-03-08 LAB — T4, FREE: Free T4: 0.39 ng/dL — ABNORMAL LOW (ref 0.60–1.60)

## 2017-03-08 LAB — TSH: TSH: 2.01 u[IU]/mL (ref 0.35–4.50)

## 2017-03-08 MED ORDER — COSYNTROPIN NICU IV SYRINGE 0.25 MG/ML (STANDARD DOSE)
0.2500 mg | Freq: Once | INTRAVENOUS | Status: AC
  Administered 2017-03-08: 0.25 mg via INTRAMUSCULAR

## 2017-03-08 MED ORDER — LEVOTHYROXINE SODIUM 50 MCG PO TABS: 50.0000 ug | ORAL_TABLET | Freq: Every day | ORAL | 5 refills | Status: DC

## 2017-03-08 MED ORDER — PREDNISONE 1 MG PO TABS: 2.0000 mg | ORAL_TABLET | Freq: Every day | ORAL | 2 refills | Status: DC

## 2017-03-08 NOTE — Patient Instructions (Addendum)
blood tests are requested for you today.  We'll let you know about the results.  Please come back for a follow-up appointment in 1 month.  

## 2017-03-08 NOTE — Progress Notes (Signed)
Subjective:    Patient ID: Cindy Lamb, female    DOB: 08/21/1975, 41 y.o.   MRN: [id number]  HPI Pt is ref bt Dr Kathyrn Sheriff, for pituitary disorder.  Pt states 6 months of intermitt severe pain at the head, and assoc fatigue.  She was found to have a cyst at or near the pituitary.  She is ref to eval pituitary function.   Past Medical History:  Diagnosis Date  . Anemia   . 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 Depression   . High cholesterol   . IBS (irritable bowel syndrome)   . Low back pain    2 bulging discs  . Migraines   . Spinal stenosis    Mild    Past Surgical History:  Procedure Laterality Date  . CYSTECTOMY  2006  . ORIF ANKLE FRACTURE BIMALLEOLAR  2010  . PARTIAL HYSTERECTOMY  2006    Social History   Social History  . Marital status: Single    Spouse name: N/A  . Number of children: 2  . Years of education: College   Occupational History  .      [employer] History Main Topics  . Smoking status: Current Every Day Smoker    Packs/day: 0.25    Types: Cigarettes  . Smokeless tobacco: Never Used  . Alcohol use No     Comment: 0-5 drinks per wk  . Drug use: No     Comment: occ marijuana  . Sexual activity: Not on file   Other Topics Concern  . Not on file   Social History Narrative   Lives at home w/ her son   Right-handed   Occasional caffeine    Current Outpatient Prescriptions on File Prior to Visit  Medication Sig Dispense Refill  . acetaminophen (TYLENOL) 500 MG tablet Take 500 mg by mouth every 6 (six) hours as needed.    . Cholecalciferol (VITAMIN D PO) Take 5,000 Units by mouth daily.     . fluticasone (FLONASE) 50 MCG/ACT nasal spray Place into both nostrils daily as needed for allergies or rhinitis.    Marland Kitchen gabapentin (NEURONTIN) 100 MG capsule Take 1 capsule (100 mg total) by mouth 3 (three) times daily. 60 capsule 5  . Multiple Vitamin (MULTIVITAMIN) tablet Take 1 tablet by mouth daily.    . ondansetron  (ZOFRAN) 4 MG tablet Take 1 tablet (4 mg total) by mouth every 8 (eight) hours as needed for nausea or vomiting. 20 tablet 0  . OVER THE COUNTER MEDICATION IB Guard--peppermint capsules for gi issues    . valACYclovir (VALTREX) 500 MG tablet Take 500 mg by mouth as needed.    . carisoprodol (SOMA) 350 MG tablet Take 350 mg by mouth 3 times/day as needed-between meals & bedtime for muscle spasms. Reported on 12/13/2015    . loperamide (IMODIUM) 2 MG capsule Take by mouth as needed for diarrhea or loose stools.    . ranitidine (ZANTAC) 150 MG tablet Take 150 mg by mouth 2 (two) times daily.    . Vitamin D, Ergocalciferol, (DRISDOL) 50000 units CAPS capsule Take 50,000 Units by mouth every 7 (seven) days.    . Wheat Dextrin (BENEFIBER PO) Take by mouth.     No current facility-administered medications on file prior to visit.     No Known Allergies  Family History  Problem Relation Age of Onset  . Prostate cancer Maternal Grandfather   . Hypertension Maternal Grandfather   . High  Cholesterol Maternal Grandfather   . Hypertension Maternal Grandmother   . High Cholesterol Maternal Grandmother   . Other Neg Hx        pituitary disorder    BP 120/72   Pulse 60   Ht 5\' 7"  (1.702 m)   Wt 163 lb (73.9 kg)   SpO2 98%   BMI 25.53 kg/m    Review of Systems denies syncope, rash, depression, visual loss, galactorrhea, change in facial appearance, rhinorrhea.  She has muscle weakness, easy bruising, and depression.  She has a sound in her left ear.  She has polyuria. She has lost 20 lbs x a few months.  She has intermitt diarrhea, and nausea.  She has no menses, due to TAH (no BSO).     Objective:   Physical Exam VS: see vs page GEN: no distress HEAD: head: no deformity eyes: no periorbital swelling, no proptosis external nose and ears are normal mouth: no lesion seen NECK: supple, thyroid is not enlarged CHEST WALL: no deformity LUNGS: clear to auscultation CV: reg rate and rhythm, no  murmur ABD: abdomen is soft, nontender.  no hepatosplenomegaly.  not distended.  no hernia MUSCULOSKELETAL: muscle bulk and strength are grossly normal.  no obvious joint swelling.  gait is normal and steady EXTEMITIES: no deformity.  no ulcer on the feet.  feet are of normal color and temp.  Trace bilat leg edema.   PULSES: dorsalis pedis intact bilat.  no carotid bruit NEURO:  cn 2-12 grossly intact.   readily moves all 4's.  sensation is intact to touch on the feet SKIN:  Normal texture and temperature.  No rash or suspicious lesion is visible.   NODES:  None palpable at the neck PSYCH: alert, well-oriented.  Does not appear anxious nor depressed.  I have reviewed outside records, and summarized: Pt was noted to have elevated a1c, and referred here.  Plan is to follow MRI in 6 months.  outside test results are reviewed: IGF-1=normal FSH and LH are low-normal TSH=normal Free T4=slightly low     Assessment & Plan:  Pituitary cyst, new.  W/u needed. Surgical menopause: we can't interpret gonadotropins in this setting.  Weight loss: ? pituitary-related   Patient Instructions  blood tests are requested for you today.  We'll let you know about the results.   Please come back for a follow-up appointment in 1 month.

## 2017-03-13 ENCOUNTER — Encounter (HOSPITAL_COMMUNITY): Payer: Self-pay | Admitting: Emergency Medicine

## 2017-03-13 ENCOUNTER — Ambulatory Visit (HOSPITAL_COMMUNITY)
Admission: EM | Admit: 2017-03-13 | Discharge: 2017-03-13 | Disposition: A | Discharge status: Home / Self Care | Payer: BLUE CROSS/BLUE SHIELD | Attending: Family Medicine | Admitting: Family Medicine

## 2017-03-13 DIAGNOSIS — M65932 Unspecified synovitis and tenosynovitis, left forearm: Secondary | ICD-10-CM

## 2017-03-13 DIAGNOSIS — M659 Synovitis and tenosynovitis, unspecified: Secondary | ICD-10-CM

## 2017-03-13 MED ORDER — METHYLPREDNISOLONE ACETATE 40 MG/ML IJ SUSP
INTRAMUSCULAR | Status: AC
  Filled 2017-03-13: qty 1

## 2017-03-13 MED ORDER — LIDOCAINE HCL 2 % IJ SOLN
INTRAMUSCULAR | Status: AC
Start: 2017-03-13 — End: 2017-03-13
  Filled 2017-03-13: qty 20

## 2017-03-13 NOTE — Discharge Instructions (Signed)
Return if pain persists. °

## 2017-03-13 NOTE — ED Triage Notes (Signed)
Pt. Stated, I work from home and have had left wrist medial pain for 3 weeks. No brace or Ibuprofen helps.

## 2017-03-13 NOTE — ED Provider Notes (Signed)
[place]   [id number] 03/13/17 Arrival Time: 7672   SUBJECTIVE:  Cindy Lamb is a 41 y.o. female who presents to the urgent care with complaint of left wrist radial side pain for 3 weeks. No brace or Ibuprofen helps.     Past Medical History:  Diagnosis Date  . Anemia   . 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 Depression   . High cholesterol   . IBS (irritable bowel syndrome)   . Low back pain    2 bulging discs  . Migraines   . Spinal stenosis    Mild   Family History  Problem Relation Age of Onset  . Prostate cancer Maternal Grandfather   . Hypertension Maternal Grandfather   . High Cholesterol Maternal Grandfather   . Hypertension Maternal Grandmother   . High Cholesterol Maternal Grandmother   . Other Neg Hx        pituitary disorder   Social History   Social History  . Marital status: Single    Spouse name: N/A  . Number of children: 2  . Years of education: College   Occupational History  .      [employer]nership for [employer] History Main Topics  . Smoking status: Current Every Day Smoker    Packs/day: 0.25    Types: Cigarettes  . Smokeless tobacco: Never Used  . Alcohol use No     Comment: 0-5 drinks per wk  . Drug use: No     Comment: occ marijuana  . Sexual activity: Not on file   Other Topics Concern  . Not on file   Social History Narrative   Lives at home w/ her son   Right-handed   Occasional caffeine   No outpatient prescriptions have been marked as taking for the 03/13/17 encounter [facility] Encounter).   No Known Allergies    ROS: As per HPI, remainder of ROS negative.   OBJECTIVE:   Vitals:   03/13/17 1803 03/13/17 1804  BP:  (!) 151/88  Pulse: (!) 54   Resp: 16   Temp: 98.3 F (36.8 C)   TempSrc: Oral   SpO2: 100%   Weight:  167 lb (75.8 kg)  Height:  5\' 7"  (1.702 m)     General appearance: alert; no distress Eyes: PERRL; EOMI; conjunctiva normal HENT: normocephalic; atraumatic; TMs  normal, canal normal, external ears normal without trauma; nasal mucosa normal; oral mucosa normal Neck: supple Back: no CVA tenderness Extremities: no cyanosis or edema; symmetrical with no gross deformities, very tender left radial wrist Skin: warm and dry Neurologic: normal gait; grossly normal Psychological: alert and cooperative; normal mood and affect      Labs:  Results for orders placed or performed in visit on 03/08/17  T4, free  Result Value Ref Range   Free T4 0.39 (L) 0.60 - 1.60 ng/dL  TSH  Result Value Ref Range   TSH 2.01 0.35 - 4.50 uIU/mL  Urinalysis, Routine w reflex microscopic  Result Value Ref Range   Color, Urine YELLOW Yellow;Lt. Yellow   APPearance CLEAR Clear   Specific Gravity, Urine <=1.005 (A) 1.000 - 1.030   pH 6.0 5.0 - 8.0   Total Protein, Urine NEGATIVE Negative   Urine Glucose NEGATIVE Negative   Ketones, ur NEGATIVE Negative   Bilirubin Urine NEGATIVE Negative   Hgb urine dipstick NEGATIVE Negative   Urobilinogen, UA 0.2 0.0 - 1.0   Leukocytes, UA NEGATIVE Negative   Nitrite NEGATIVE Negative   WBC,  UA none seen 0-2/hpf   RBC / HPF none seen 0-2/hpf   Squamous Epithelial / LPF Rare(0-4/hpf) Rare(0-4/hpf)  Cortisol  Result Value Ref Range   Cortisol, Plasma 3.5 ug/dL  Cortisol  Result Value Ref Range   Cortisol, Plasma 10.4 ug/dL  Basic metabolic panel  Result Value Ref Range   Sodium 139 135 - 145 mEq/L   Potassium 3.6 3.5 - 5.1 mEq/L   Chloride 104 96 - 112 mEq/L   CO2 29 19 - 32 mEq/L   Glucose, Bld 85 70 - 99 mg/dL   BUN 7 6 - 23 mg/dL   Creatinine, Ser 1.01 0.40 - 1.20 mg/dL   Calcium 10.0 8.4 - 10.5 mg/dL   GFR 78.26 >60.00 mL/min    Labs Reviewed - No data to display  No results found.  Injection tendon or ligament Date/Time: 03/13/2017 6:34 PM Performed by: Robyn Haber Authorized by: Robyn Haber  Consent: Verbal consent obtained. Consent given by: patient Patient understanding: patient states  understanding of the procedure being performed Local anesthesia used: no  Anesthesia: Local anesthesia used: no  Sedation: Patient sedated: no Patient tolerance: Patient tolerated the procedure well with no immediate complications Comments: Left radial wrist:  Extensor pollicis longus tendon      ASSESSMENT & PLAN:  1. Tenosynovitis of left wrist     No orders of the defined types were placed in this encounter.   Reviewed expectations re: course of current medical issues. Questions answered. Outlined signs and symptoms indicating need for more acute intervention. Patient verbalized understanding. After Visit Summary given.    Procedures:      Robyn Haber, MD 03/13/17 [phone]

## 2017-03-19 LAB — ARGININE VASOPRESSIN HORMONE

## 2017-03-23 ENCOUNTER — Other Ambulatory Visit: Payer: Self-pay | Admitting: Endocrinology

## 2017-03-23 ENCOUNTER — Encounter: Payer: Self-pay | Admitting: Endocrinology

## 2017-03-23 MED ORDER — DESMOPRESSIN ACETATE 0.1 MG PO TABS: 0.1000 mg | ORAL_TABLET | Freq: Every day | ORAL | 11 refills | Status: DC

## 2017-04-06 ENCOUNTER — Ambulatory Visit: Payer: BLUE CROSS/BLUE SHIELD | Admitting: Endocrinology

## 2017-05-25 ENCOUNTER — Other Ambulatory Visit: Payer: Self-pay | Admitting: Adult Health

## 2017-06-04 ENCOUNTER — Other Ambulatory Visit: Payer: Self-pay | Admitting: Endocrinology

## 2017-07-02 ENCOUNTER — Other Ambulatory Visit: Payer: Self-pay | Admitting: Neurosurgery

## 2017-07-02 DIAGNOSIS — R220 Localized swelling, mass and lump, head: Principal | ICD-10-CM

## 2017-07-02 DIAGNOSIS — G9389 Other specified disorders of brain: Secondary | ICD-10-CM

## 2017-07-03 ENCOUNTER — Encounter: Payer: Self-pay | Admitting: Endocrinology

## 2017-07-03 ENCOUNTER — Ambulatory Visit: Payer: BLUE CROSS/BLUE SHIELD | Admitting: Endocrinology

## 2017-07-03 VITALS — BP 132/92 | HR 64 | Wt 177.6 lb

## 2017-07-03 DIAGNOSIS — E230 Hypopituitarism: Secondary | ICD-10-CM | POA: Diagnosis not present

## 2017-07-03 LAB — BASIC METABOLIC PANEL
BUN: 6 mg/dL (ref 6–23)
CHLORIDE: 103 meq/L (ref 96–112)
CO2: 30 meq/L (ref 19–32)
Calcium: 9.2 mg/dL (ref 8.4–10.5)
Creatinine, Ser: 0.84 mg/dL (ref 0.40–1.20)
GFR: 96.64 mL/min (ref >60.00)
Glucose, Bld: 100 mg/dL — ABNORMAL HIGH (ref 70–99)
POTASSIUM: 3.2 meq/L — AB (ref 3.5–5.1)
SODIUM: 139 meq/L (ref 135–145)

## 2017-07-03 LAB — URINALYSIS, ROUTINE W REFLEX MICROSCOPIC
Bilirubin Urine: NEGATIVE
HGB URINE DIPSTICK: NEGATIVE
KETONES UR: NEGATIVE
Leukocytes, UA: NEGATIVE
NITRITE: NEGATIVE
RBC / HPF: NONE SEEN (ref 0–?)
Specific Gravity, Urine: <=1.005 — AB (ref 1.000–1.030)
Total Protein, Urine: NEGATIVE
Urine Glucose: NEGATIVE
Urobilinogen, UA: 0.2 (ref 0.0–1.0)
WBC UA: NONE SEEN (ref 0–?)
pH: 7 (ref 5.0–8.0)

## 2017-07-03 LAB — TSH: TSH: 0.29 u[IU]/mL — AB (ref 0.35–4.50)

## 2017-07-03 LAB — T4, FREE: FREE T4: 0.68 ng/dL (ref 0.60–1.60)

## 2017-07-03 MED ORDER — DESMOPRESSIN ACETATE 0.1 MG PO TABS: 0.1000 mg | ORAL_TABLET | Freq: Every day | ORAL | 11 refills | Status: DC

## 2017-07-03 MED ORDER — PREDNISONE 1 MG PO TABS: 1.0000 mg | ORAL_TABLET | Freq: Every day | ORAL | 11 refills | Status: DC

## 2017-07-03 NOTE — Progress Notes (Signed)
Subjective:    Patient ID: Cindy Lamb, female    DOB: 1975/12/05, 42 y.o.   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  Pt states 1 month of moderate headache, and assoc fatigue.  She does not take d-DAVP, due to cost.  She has MRI scheduled soon.   Past Medical History:  Diagnosis Date  . Anemia   . 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 Depression   . High cholesterol   . IBS (irritable bowel syndrome)   . Low back pain    2 bulging discs  . Migraines   . Spinal stenosis    Mild    Past Surgical History:  Procedure Laterality Date  . CYSTECTOMY  2006  . ORIF ANKLE FRACTURE BIMALLEOLAR  2010  . PARTIAL HYSTERECTOMY  2006    Social History   Socioeconomic History  . Marital status: Single    Spouse name: Not on file  . Number of children: 2  . Years of education: College  . Highest education level: Not on file  Social Needs  . Financial resource strain: Not on file  . Food insecurity - worry: Not on file  . Food insecurity - inability: Not on file  . Transportation needs - medical: Not on file  . Transportation needs - non-medical: Not on file  Occupational History    Comment: Partnership for [employer]  Tobacco Use  . Smoking status: Current Every Day Smoker    Packs/day: 0.25    Types: Cigarettes  . Smokeless tobacco: Never Used  Substance and Sexual Activity  . Alcohol use: No    Alcohol/week: 0.0 oz    Comment: 0-5 drinks per wk  . Drug use: No    Comment: occ marijuana  . Sexual activity: Not on file  Other Topics Concern  . Not on file  Social History Narrative   Lives at home w/ her son   Right-handed   Occasional caffeine    Current Outpatient Medications on File Prior to Visit  Medication Sig Dispense Refill  . acetaminophen (TYLENOL) 500 MG tablet Take 500 mg by mouth every 6 (six) hours as  needed.    . Cholecalciferol (VITAMIN D PO) Take 5,000 Units by mouth daily.     . fluticasone (FLONASE) 50 MCG/ACT nasal spray Place into both nostrils daily as needed for allergies or rhinitis.    Marland Kitchen gabapentin (NEURONTIN) 100 MG capsule Take 1 capsule (100 mg total) by mouth 3 (three) times daily. 60 capsule 5  . levothyroxine (SYNTHROID, LEVOTHROID) 50 MCG tablet Take 1 tablet (50 mcg total) by mouth daily before breakfast. 30 tablet 5  . loperamide (IMODIUM) 2 MG capsule Take by mouth as needed for diarrhea or loose stools.    . Multiple Vitamin (MULTIVITAMIN) tablet Take 1 tablet by mouth daily.    . ondansetron (ZOFRAN) 4 MG tablet Take 1 tablet (4 mg total) by mouth every 8 (eight) hours as needed for nausea or vomiting. 20 tablet 0  . OVER THE COUNTER MEDICATION IB Guard--peppermint capsules for gi issues    . ranitidine (ZANTAC) 150 MG tablet Take 150 mg by mouth 2 (two) times daily.    . valACYclovir (VALTREX) 500 MG tablet Take 1,000 mg by mouth as needed.     . Vitamin D, Ergocalciferol, (DRISDOL) 50000 units CAPS capsule Take 50,000 Units by  mouth every 7 (seven) days.    . carisoprodol (SOMA) 350 MG tablet Take 350 mg by mouth 3 times/day as needed-between meals & bedtime for muscle spasms. Reported on 12/13/2015    . Wheat Dextrin (BENEFIBER PO) Take by mouth.     No current facility-administered medications on file prior to visit.     No Known Allergies  Family History  Problem Relation Age of Onset  . Prostate cancer Maternal Grandfather   . Hypertension Maternal Grandfather   . High Cholesterol Maternal Grandfather   . Hypertension Maternal Grandmother   . High Cholesterol Maternal Grandmother   . Other Neg Hx        pituitary disorder    BP (!) 132/92 (BP Location: Right Arm, Patient Position: Sitting, Cuff Size: Normal)   Pulse 64   Wt 177 lb 9.6 oz (80.6 kg)   SpO2 98%   BMI 27.82 kg/m   Review of Systems She has nocturia approx 5 times per night.      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 Central hypothyroidism: due for recheck  Patient Instructions  blood and urine tests are requested for you today.  We'll let you know about the results.   Please reduce the prednisone to 1 mg daily.  Please also resume the desmopressin pill.  I have sent a prescription to your pharmacy Please do the MRI as scheduled.  i'll look for the results.  Please come back for a follow-up appointment in 1 month, when we'll plan to redo the "before and after" test, like we did last time.

## 2017-07-03 NOTE — Patient Instructions (Addendum)
blood and urine tests are requested for you today.  We'll let you know about the results.   Please reduce the prednisone to 1 mg daily.  Please also resume the desmopressin pill.  I have sent a prescription to your pharmacy Please do the MRI as scheduled.  i'll look for the results.  Please come back for a follow-up appointment in 1 month, when we'll plan to redo the "before and after" test, like we did last time.

## 2017-07-07 ENCOUNTER — Ambulatory Visit
Admission: RE | Admit: 2017-07-07 | Discharge: 2017-07-07 | Disposition: A | Discharge status: Home / Self Care | Payer: BLUE CROSS/BLUE SHIELD | Source: Ambulatory Visit | Attending: Neurosurgery | Admitting: Neurosurgery

## 2017-07-07 DIAGNOSIS — G9389 Other specified disorders of brain: Secondary | ICD-10-CM

## 2017-07-07 DIAGNOSIS — R220 Localized swelling, mass and lump, head: Principal | ICD-10-CM

## 2017-07-07 MED ORDER — GADOBENATE DIMEGLUMINE 529 MG/ML IV SOLN
15.0000 mL | Freq: Once | INTRAVENOUS | Status: AC | PRN
  Administered 2017-07-07: 15 mL via INTRAVENOUS

## 2017-07-16 LAB — ARGININE VASOPRESSIN HORMONE

## 2017-08-01 ENCOUNTER — Other Ambulatory Visit: Payer: Self-pay

## 2017-08-01 MED ORDER — LEVOTHYROXINE SODIUM 50 MCG PO TABS: 50.0000 ug | ORAL_TABLET | Freq: Every day | ORAL | 1 refills | Status: DC

## 2017-08-20 ENCOUNTER — Other Ambulatory Visit: Payer: Self-pay | Admitting: Endocrinology

## 2017-12-06 ENCOUNTER — Other Ambulatory Visit: Payer: Self-pay | Admitting: Neurosurgery

## 2017-12-11 ENCOUNTER — Encounter (HOSPITAL_COMMUNITY): Payer: Self-pay | Admitting: Family Medicine

## 2017-12-11 ENCOUNTER — Ambulatory Visit (HOSPITAL_COMMUNITY)
Admission: EM | Admit: 2017-12-11 | Discharge: 2017-12-11 | Disposition: A | Discharge status: Home / Self Care | Payer: BLUE CROSS/BLUE SHIELD | Attending: Family Medicine | Admitting: Family Medicine

## 2017-12-11 DIAGNOSIS — S46911A Strain of unspecified muscle, fascia and tendon at shoulder and upper arm level, right arm, initial encounter: Secondary | ICD-10-CM | POA: Diagnosis not present

## 2017-12-11 DIAGNOSIS — M65932 Unspecified synovitis and tenosynovitis, left forearm: Secondary | ICD-10-CM

## 2017-12-11 DIAGNOSIS — M659 Synovitis and tenosynovitis, unspecified: Secondary | ICD-10-CM

## 2017-12-11 MED ORDER — BUPIVACAINE HCL (PF) 0.5 % IJ SOLN
INTRAMUSCULAR | Status: AC
  Filled 2017-12-11: qty 10

## 2017-12-11 MED ORDER — METHYLPREDNISOLONE ACETATE 40 MG/ML IJ SUSP
INTRAMUSCULAR | Status: AC
  Filled 2017-12-11: qty 1

## 2017-12-11 MED ORDER — PREDNISONE 20 MG PO TABS: ORAL_TABLET | ORAL | 0 refills | Status: DC
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
## 2018-01-02 ENCOUNTER — Other Ambulatory Visit: Payer: Self-pay | Admitting: Neurosurgery

## 2018-01-02 DIAGNOSIS — R220 Localized swelling, mass and lump, head: Principal | ICD-10-CM

## 2018-01-02 DIAGNOSIS — G9389 Other specified disorders of brain: Secondary | ICD-10-CM

## 2018-01-07 ENCOUNTER — Other Ambulatory Visit: Payer: Self-pay | Admitting: Neurosurgery

## 2018-01-09 ENCOUNTER — Other Ambulatory Visit: Payer: Self-pay | Admitting: Otolaryngology

## 2018-01-12 ENCOUNTER — Inpatient Hospital Stay
Admission: RE | Admit: 2018-01-12 | Discharge: 2018-01-12 | Disposition: A | Discharge status: Home / Self Care | Payer: BLUE CROSS/BLUE SHIELD | Source: Ambulatory Visit | Attending: Neurosurgery | Admitting: Neurosurgery

## 2018-01-22 ENCOUNTER — Other Ambulatory Visit: Payer: BLUE CROSS/BLUE SHIELD

## 2018-01-23 NOTE — Pre-Procedure Instructions (Signed)
[place]  01/23/2018      [id number] [address], [place] [address] Phone: [phone] Fax: [phone]    Your procedure is scheduled on Aug. 16  Report to [facility] Admitting at 5:30 A.M.  Call this number if you have problems the morning of surgery:  [phone]   Remember:  Do not eat or drink after midnight.                         Take these medicines the morning of surgery with A SIP OF WATER :               flonase nasal spray             Gabapentin (neurontin)             Eye drops if needed             Levothyroxine (synthroid)             zofran if needed             Prednisone (deltasone)             ranitidine (zantac) if needed             Valtrex            7 days prior to surgery STOP taking any Aspirin(unless otherwise instructed by your surgeon), Aleve, Naproxen, Ibuprofen, Motrin, Advil, Goody's, BC's, all herbal medications, fish oil, and all vitamins         Do not wear jewelry, make-up or nail polish.  Do not wear lotions, powders, or perfumes, or deodorant.  Do not shave 48 hours prior to surgery.  Men may shave face and neck.  Do not bring valuables to the hospital.  [facility] is not responsible for any belongings or valuables.  Contacts, dentures or bridgework may not be worn into surgery.  Leave your suitcase in the car.  After surgery it may be brought to your room.  For patients admitted to the hospital, discharge time will be determined by your treatment team.  Patients discharged the day of surgery will not be allowed to drive home.    Special instructions:  [facility]- Preparing For Surgery  Before surgery, you can play an important role. Because skin is not sterile, your skin needs to be as free of germs as possible. You can reduce the number of germs on your skin by washing with CHG (chlorahexidine gluconate) Soap before surgery.  CHG is an  antiseptic cleaner which kills germs and bonds with the skin to continue killing germs even after washing.    Oral Hygiene is also important to reduce your risk of infection.  Remember - BRUSH YOUR TEETH THE MORNING OF SURGERY WITH YOUR REGULAR TOOTHPASTE  Please do not use if you have an allergy to CHG or antibacterial soaps. If your skin becomes reddened/irritated stop using the CHG.  Do not shave (including legs and underarms) for at least 48 hours prior to first CHG shower. It is OK to shave your face.  Please follow these instructions carefully.   1. Shower the NIGHT BEFORE SURGERY and the MORNING OF SURGERY with CHG.   2. If you chose to wash your hair, wash your hair first as usual with your normal shampoo.  3. After you shampoo, rinse your hair and body thoroughly to remove  the shampoo.  4. Use CHG as you would any other liquid soap. You can apply CHG directly to the skin and wash gently with a scrungie or a clean washcloth.   5. Apply the CHG Soap to your body ONLY FROM THE NECK DOWN.  Do not use on open wounds or open sores. Avoid contact with your eyes, ears, mouth and genitals (private parts). Wash Face and genitals (private parts)  with your normal soap.  6. Wash thoroughly, paying special attention to the area where your surgery will be performed.  7. Thoroughly rinse your body with warm water from the neck down.  8. DO NOT shower/wash with your normal soap after using and rinsing off the CHG Soap.  9. Pat yourself dry with a CLEAN TOWEL.  10. Wear CLEAN PAJAMAS to bed the night before surgery, wear comfortable clothes the morning of surgery  11. Place CLEAN SHEETS on your bed the night of your first shower and DO NOT SLEEP WITH PETS.    Day of Surgery:  Do not apply any deodorants/lotions.  Please wear clean clothes to the hospital/surgery center.   Remember to brush your teeth WITH YOUR REGULAR TOOTHPASTE.    Please read over the following fact sheets that you  were given. Coughing and Deep Breathing and Surgical Site Infection Prevention

## 2018-01-24 ENCOUNTER — Encounter (HOSPITAL_COMMUNITY): Payer: Self-pay

## 2018-01-24 ENCOUNTER — Other Ambulatory Visit: Payer: Self-pay

## 2018-01-24 ENCOUNTER — Encounter (HOSPITAL_COMMUNITY)
Admission: RE | Admit: 2018-01-24 | Discharge: 2018-01-24 | Disposition: A | Discharge status: Home / Self Care | Payer: BLUE CROSS/BLUE SHIELD | Source: Ambulatory Visit | Attending: Neurosurgery | Admitting: Neurosurgery

## 2018-01-24 DIAGNOSIS — Z79899 Other long term (current) drug therapy: Secondary | ICD-10-CM | POA: Insufficient documentation

## 2018-01-24 DIAGNOSIS — R079 Chest pain, unspecified: Secondary | ICD-10-CM | POA: Insufficient documentation

## 2018-01-24 DIAGNOSIS — Z90710 Acquired absence of both cervix and uterus: Secondary | ICD-10-CM | POA: Diagnosis not present

## 2018-01-24 DIAGNOSIS — E7800 Pure hypercholesterolemia, unspecified: Secondary | ICD-10-CM | POA: Insufficient documentation

## 2018-01-24 DIAGNOSIS — K589 Irritable bowel syndrome without diarrhea: Secondary | ICD-10-CM | POA: Diagnosis not present

## 2018-01-24 DIAGNOSIS — Z01818 Encounter for other preprocedural examination: Secondary | ICD-10-CM | POA: Insufficient documentation

## 2018-01-24 DIAGNOSIS — R002 Palpitations: Secondary | ICD-10-CM | POA: Insufficient documentation

## 2018-01-24 DIAGNOSIS — Z0183 Encounter for blood typing: Secondary | ICD-10-CM | POA: Diagnosis not present

## 2018-01-24 DIAGNOSIS — Z01812 Encounter for preprocedural laboratory examination: Secondary | ICD-10-CM | POA: Insufficient documentation

## 2018-01-24 DIAGNOSIS — E039 Hypothyroidism, unspecified: Secondary | ICD-10-CM | POA: Diagnosis not present

## 2018-01-24 DIAGNOSIS — Z7951 Long term (current) use of inhaled steroids: Secondary | ICD-10-CM | POA: Insufficient documentation

## 2018-01-24 DIAGNOSIS — E237 Disorder of pituitary gland, unspecified: Secondary | ICD-10-CM | POA: Diagnosis not present

## 2018-01-24 DIAGNOSIS — M4800 Spinal stenosis, site unspecified: Secondary | ICD-10-CM | POA: Insufficient documentation

## 2018-01-24 DIAGNOSIS — Z79890 Hormone replacement therapy (postmenopausal): Secondary | ICD-10-CM | POA: Diagnosis not present

## 2018-01-24 HISTORY — DX: Hypothyroidism, unspecified: E03.9

## 2018-01-24 HISTORY — DX: Cardiac arrhythmia, unspecified: I49.9

## 2018-01-24 HISTORY — DX: Anxiety disorder, unspecified: F41.9

## 2018-01-24 HISTORY — DX: Dyspnea, unspecified: R06.00

## 2018-01-24 LAB — BASIC METABOLIC PANEL
ANION GAP: 8 (ref 5–15)
BUN: 6 mg/dL (ref 6–20)
CALCIUM: 9.9 mg/dL (ref 8.9–10.3)
CO2: 30 mmol/L (ref 22–32)
CREATININE: 0.9 mg/dL (ref 0.44–1.00)
Chloride: 105 mmol/L (ref 98–111)
GFR calc Af Amer: >60 mL/min (ref >60)
GLUCOSE: 102 mg/dL — AB (ref 70–99)
Potassium: 4 mmol/L (ref 3.5–5.1)
Sodium: 143 mmol/L (ref 135–145)

## 2018-01-24 LAB — CBC
HCT: 39.4 % (ref 36.0–46.0)
Hemoglobin: 12.4 g/dL (ref 12.0–15.0)
MCH: 29.3 pg (ref 26.0–34.0)
MCHC: 31.5 g/dL (ref 30.0–36.0)
MCV: 93.1 fL (ref 78.0–100.0)
PLATELETS: 289 10*3/uL (ref 150–400)
RBC: 4.23 MIL/uL (ref 3.87–5.11)
RDW: 12.2 % (ref 11.5–15.5)
WBC: 7.4 10*3/uL (ref 4.0–10.5)

## 2018-01-24 LAB — ABO/RH: ABO/RH(D): AB POS

## 2018-01-24 LAB — TYPE AND SCREEN
ABO/RH(D): AB POS
Antibody Screen: NEGATIVE

## 2018-01-24 NOTE — Pre-Procedure Instructions (Signed)
[place]  01/24/2018      [id number] [address], [place] [address] Phone: [phone] Fax: [phone]    Your procedure is scheduled on Friday,  August 16th   Report to [facility] Admitting at 5:30 A.M.             (posted surgery time 7:30a - 12:20p)   Call this number if you have problems the morning of surgery:  [phone]   Remember:  Do not eat any foods or drink any liquids after midnight, Thursday.                         Take these medicines the morning of surgery with A SIP OF WATER :               flonase nasal spray             Gabapentin (neurontin)             Eye drops if needed             Levothyroxine (synthroid)             zofran if needed             Prednisone (deltasone)             ranitidine (zantac) if needed             Valtrex            7 days prior to surgery STOP taking any Aspirin(unless otherwise instructed by your surgeon), Aleve, Naproxen, Ibuprofen, Motrin, Advil, Goody's, BC's, all herbal medications, fish oil, and all vitamins       Do not wear jewelry, make-up or nail polish.  Do not wear lotions, powders,  perfumes, or deodorant.  Do not shave 48 hours prior to surgery.   Do not bring valuables to the hospital.  [facility] is not responsible for any belongings or valuables.  Contacts, dentures or bridgework may not be worn into surgery.  Leave your suitcase in the car.  After surgery it may be brought to your room.  For patients admitted to the hospital, discharge time will be determined by your treatment team.       [facility]- Preparing For Surgery  Before surgery, you can play an important role. Because skin is not sterile, your skin needs to be as free of germs as possible. You can reduce the number of germs on your skin by washing with CHG (chlorahexidine gluconate) Soap before surgery.  CHG is an antiseptic cleaner which kills germs  and bonds with the skin to continue killing germs even after washing.    Oral Hygiene is also important to reduce your risk of infection.    Remember - BRUSH YOUR TEETH THE MORNING OF SURGERY WITH YOUR REGULAR TOOTHPASTE  Please do not use if you have an allergy to CHG or antibacterial soaps. If your skin becomes reddened/irritated stop using the CHG.  Do not shave (including legs and underarms) for at least 48 hours prior to first CHG shower. It is OK to shave your face.  Please follow these instructions carefully.   1. Shower the NIGHT BEFORE SURGERY and the MORNING OF SURGERY with CHG.   2. If you chose to wash your hair, wash your hair first as usual with your normal shampoo.  3. After you shampoo, rinse  your hair and body thoroughly to remove the shampoo.  4. Use CHG as you would any other liquid soap. You can apply CHG directly to the skin and wash gently with a scrungie or a clean washcloth.   5. Apply the CHG Soap to your body ONLY FROM THE NECK DOWN.  Do not use on open wounds or open sores. Avoid contact with your eyes, ears, mouth and genitals (private parts). Wash Face and genitals (private parts)  with your normal soap.  6. Wash thoroughly, paying special attention to the area where your surgery will be performed.  7. Thoroughly rinse your body with warm water from the neck down.  8. DO NOT shower/wash with your normal soap after using and rinsing off the CHG Soap.  9. Pat yourself dry with a CLEAN TOWEL.  10. Wear CLEAN PAJAMAS to bed the night before surgery, wear comfortable clothes the morning of surgery  11. Place CLEAN SHEETS on your bed the night of your first shower and DO NOT SLEEP WITH PETS.  Day of Surgery:  Do not apply any deodorants/lotions.  Please wear clean clothes to the hospital/surgery center.    Remember to brush your teeth WITH YOUR REGULAR TOOTHPASTE.  Please read over the following fact sheets that you were given. Surgical Site Infection  Prevention

## 2018-01-24 NOTE — Progress Notes (Signed)
PCP is Dorothy Scifress, [place]   LOV 11/2017 Endo is Sr. Renato Shin  LOV 06/2017 She denies seeing cardio, no murmur, no sob. She did remark that she can feel palpitations and sharp pains left of mid sternal area.  She believes these are stress related. Todays EKG shows SB with PAC's Willeen Cass , NP to interview.

## 2018-01-25 ENCOUNTER — Ambulatory Visit
Admission: RE | Admit: 2018-01-25 | Discharge: 2018-01-25 | Disposition: A | Discharge status: Home / Self Care | Payer: BLUE CROSS/BLUE SHIELD | Source: Ambulatory Visit | Attending: Neurosurgery | Admitting: Neurosurgery

## 2018-01-25 DIAGNOSIS — G9389 Other specified disorders of brain: Secondary | ICD-10-CM

## 2018-01-25 DIAGNOSIS — R220 Localized swelling, mass and lump, head: Principal | ICD-10-CM

## 2018-01-25 MED ORDER — GADOBENATE DIMEGLUMINE 529 MG/ML IV SOLN
10.0000 mL | Freq: Once | INTRAVENOUS | Status: AC | PRN
  Administered 2018-01-25: 10 mL via INTRAVENOUS

## 2018-01-25 NOTE — Progress Notes (Signed)
Case:  [id number] Date/Time:  02/01/18 0715   Procedures:      CRANIOTOMY TRANSPHENOIDAL RESECTION OF PITUITARY TUMOR (N/A ) - CRANIOTOMY TRANSPHENOIDAL RESECTION OF PITUITARY TUMOR     TRANSPHENOIDAL APPROACH EXPOSURE WITH FUSION PROTOCOL (N/A )     POSSIBLE  NASAL SEPTOPLASTY WITH TURBINATE REDUCTION CONSIDER ENDOSCOPIC  EXCISION OF LEFT CONCHA BULLOSA AND REMOVAL OF LEFT MAXILLARY SINUS MUCOUS RETENTION CYST. (N/A )   Anesthesia type:  General   Pre-op diagnosis:  SUPRASELLAR MASS   Location:  [facility] ROOM 21 / [facility]   Surgeon:  Consuella Lose, MD; Jerrell Belfast, MD      DISCUSSION: - Pt is a 42 year old female with hx migraines.   - Pt reported at pre-admission testing having flushing episodes 2-3x/month in last few months.  No other associated symptoms.  Does not turn red, just feels warm, like she is flushed.  Sx resolve after ~20 minutes of rest.  No sx today.   - Pt reported at pre-admission testing she had localized chest pain L side of chest just left of sternum a week ago.  She pinpointed with 1 finger area of pain.  It felt like someone was "stabbing me with a pen, not a knife, it wasn't sharp". This lasted for a few seconds.  There were no associated sx.  No similar episodes since.   - Pt reported at pre-admission testing she occasionally gets palpitations.  They started about a year ago.  Palpitations occur less than once a month and last for a few seconds.  There are no associated sx.  Palpitations are not bothersome, none today. There is a PAC on pt's EKG today.    - I am not concerned about any of the above c/o. I offered pt reassurance and discussed situations that would need further evaluation/immediate emergent care.    - On exam, pt HRRR, lungs CTA B. No peripheral edema.     VS: BP 117/73   Pulse 62   Temp 37.1 C   Resp 20   Ht 5\' 7"  (1.702 m)   Wt 83.9 kg   SpO2 97%   BMI 28.98 kg/m   PROVIDERS: - PCP is Scifres, Dorothy, PA-C   LABS: Labs reviewed:  Acceptable for surgery. (all labs ordered are listed, but only abnormal results are displayed)  Labs Reviewed  BASIC METABOLIC PANEL - Abnormal; Notable for the following components:      Result Value   Glucose, Bld 102 (*)    All other components within normal limits  CBC  TYPE AND SCREEN  ABO/RH    EKG 01/24/18: Sinus bradycardia (57 bpm) with PACs. Nonspecific T wave abnormality    Past Medical History:  Diagnosis Date  . Anemia   . Anxiety   . 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 Dyspnea    not normally  . Dysrhythmia    palpitations occasionally with chest pain (stabbing pain)   PAC's  . High cholesterol   . Hypothyroidism   . IBS (irritable bowel syndrome)   . Low back pain    2 bulging discs  . Migraines   . Spinal stenosis    Mild    Past Surgical History:  Procedure Laterality Date  . ABDOMINAL HYSTERECTOMY    . CYSTECTOMY  2006  . DILATION AND CURETTAGE OF UTERUS    . ORIF ANKLE FRACTURE BIMALLEOLAR  2010  . PARTIAL HYSTERECTOMY  2006  . wisdom teeth removal      MEDICATIONS: .  bisacodyl (DULCOLAX) 5 MG EC tablet  . cholecalciferol (VITAMIN D) 400 units TABS tablet  . desmopressin (DDAVP) 0.1 MG tablet  . fluticasone (FLONASE) 50 MCG/ACT nasal spray  . gabapentin (NEURONTIN) 100 MG capsule  . ibuprofen (ADVIL,MOTRIN) 200 MG tablet  . ketotifen (ZADITOR) 0.025 % ophthalmic solution  . levothyroxine (SYNTHROID, LEVOTHROID) 50 MCG tablet  . loperamide (IMODIUM) 2 MG capsule  . Multiple Vitamin (MULTIVITAMIN) tablet  . Naphazoline HCl (CLEAR EYES OP)  . ondansetron (ZOFRAN) 4 MG tablet  . predniSONE (DELTASONE) 1 MG tablet  . predniSONE (DELTASONE) 20 MG tablet  . ranitidine (ZANTAC) 75 MG tablet  . valACYclovir (VALTREX) 1000 MG tablet   No current facility-administered medications for this encounter.     If no changes, I anticipate pt can proceed with surgery as scheduled.   Willeen Cass, FNP-BC [facility] Short Stay Surgical  Center/Anesthesiology Phone: [phone] 01/25/2018 4:07 PM

## 2018-01-31 NOTE — H&P (Signed)
Chief Complaint   Pituitary mass  HPI   HPI: Cindy Lamb is a 42 y.o. female with known cystic sellar/suprasellar mass mass that was discovered a little over a year ago when she began having headaches in combination with endocrine dysfunction who which she has been seeing Dr Loanne Drilling. Unfortunately, over the last few months, her headaches have increased in intensity and frequency. Because of the worsening headaches, she has decided to pursue surgical resection. She has been seen and evaluated by ophthalmology and visual field testing was normal.   She presents today for surgery.  She is without any concerns.  Patient Active Problem List   Diagnosis Date Noted  . 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 Cervicogenic headache 12/13/2015    PMH: Past Medical History:  Diagnosis Date  . Anemia   . Anxiety   . 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 Dyspnea    not normally  . Dysrhythmia    palpitations occasionally with chest pain (stabbing pain)   PAC's  . High cholesterol   . Hypothyroidism   . IBS (irritable bowel syndrome)   . Low back pain    2 bulging discs  . Migraines   . Spinal stenosis    Mild    PSH: Past Surgical History:  Procedure Laterality Date  . ABDOMINAL HYSTERECTOMY    . CYSTECTOMY  2006  . DILATION AND CURETTAGE OF UTERUS    . ORIF ANKLE FRACTURE BIMALLEOLAR  2010  . PARTIAL HYSTERECTOMY  2006  . wisdom teeth removal      No medications prior to admission.    SH: Social History   Tobacco Use  . Smoking status: Current Every Day Smoker    Packs/day: 0.25    Years: 18.00    Pack years: 4.50    Types: Cigarettes  . Smokeless tobacco: Never Used  Substance Use Topics  . Alcohol use: No    Alcohol/week: 0.0 standard drinks    Comment: 0-5 drinks per wk  . Drug use: No    Comment: occ marijuana    MEDS: Prior to Admission medications   Medication Sig Start Date End Date Taking? Authorizing Provider  bisacodyl (DULCOLAX) 5 MG EC tablet  Take 5 mg by mouth daily as needed for moderate constipation.   Yes [provider]  cholecalciferol (VITAMIN D) 400 units TABS tablet Take 2,000 Units by mouth daily.   Yes [provider]  desmopressin (DDAVP) 0.1 MG tablet Take 1 tablet (0.1 mg total) by mouth at bedtime. 07/03/17  Yes Renato Shin, MD  fluticasone [facility]) 50 MCG/ACT nasal spray Place 2 sprays into both nostrils daily.    Yes [provider]  gabapentin (NEURONTIN) 100 MG capsule Take 1 capsule (100 mg total) by mouth 3 (three) times daily. Patient taking differently: Take 300 mg by mouth 2 (two) times daily.  11/16/16  Yes Ward Givens, NP  ibuprofen (ADVIL,MOTRIN) 200 MG tablet Take 800 mg by mouth 2 (two) times daily as needed for moderate pain.   Yes [provider]  ketotifen (ZADITOR) 0.025 % ophthalmic solution Place 1 drop into both eyes daily as needed (allergies).   Yes [provider]  levothyroxine (SYNTHROID, LEVOTHROID) 50 MCG tablet Take 1 tablet (50 mcg total) by mouth daily before breakfast. 08/01/17  Yes Renato Shin, MD  loperamide (IMODIUM) 2 MG capsule Take 2 mg by mouth as needed for diarrhea or loose stools.    Yes [provider]  Multiple Vitamin (MULTIVITAMIN) tablet Take  1 tablet by mouth daily.   Yes [provider]  Naphazoline HCl (CLEAR EYES OP) Place 1 drop into both eyes daily as needed (dry/red eyes).   Yes [provider]  ondansetron (ZOFRAN) 4 MG tablet Take 1 tablet (4 mg total) by mouth every 8 (eight) hours as needed for nausea or vomiting. Patient taking differently: Take 4 mg by mouth every 6 (six) hours as needed for nausea or vomiting.  11/16/16  Yes Ward Givens, NP  ranitidine (ZANTAC) 75 MG tablet Take 75 mg by mouth 2 (two) times daily as needed for heartburn.   Yes [provider]  valACYclovir (VALTREX) 1000 MG tablet Take 1,000 mg by mouth daily.   Yes [provider]  predniSONE  (DELTASONE) 1 MG tablet Take 1 mg by mouth daily with breakfast.    [provider]  predniSONE (DELTASONE) 20 MG tablet Two daily with food Patient not taking: Reported on 01/18/2018 12/11/17   Robyn Haber, MD    ALLERGY: Allergies  Allergen Reactions  . Latex Rash    Social History   Tobacco Use  . Smoking status: Current Every Day Smoker    Packs/day: 0.25    Years: 18.00    Pack years: 4.50    Types: Cigarettes  . Smokeless tobacco: Never Used  Substance Use Topics  . Alcohol use: No    Alcohol/week: 0.0 standard drinks    Comment: 0-5 drinks per wk     Family History  Problem Relation Age of Onset  . Prostate cancer Maternal Grandfather   . Hypertension Maternal Grandfather   . High Cholesterol Maternal Grandfather   . Hypertension Maternal Grandmother   . High Cholesterol Maternal Grandmother   . Other Neg Hx        pituitary disorder     ROS   ROS  Exam   There were no vitals filed for this visit. General appearance: WDWN, NAD Eyes: PERRL, Fundoscopic: normal Cardiovascular: Regular rate and rhythm without murmurs, rubs, gallops. No edema or variciosities. Distal pulses normal. Pulmonary: Clear to auscultation Musculoskeletal:     Muscle tone upper extremities: Normal    Muscle tone lower extremities: Normal    Motor exam: Upper Extremities Deltoid Bicep Tricep Grip  Right 5/5 5/5 5/5 5/5  Left 5/5 5/5 5/5 5/5   Lower Extremity IP Quad PF DF EHL  Right 5/5 5/5 5/5 5/5 5/5  Left 5/5 5/5 5/5 5/5 5/5   Neurological Awake, alert, oriented Memory and concentration grossly intact Speech fluent, appropriate CNII: Visual fields normal CNIII/IV/VI: EOMI CNV: Facial sensation normal CNVII: Symmetric, normal strength CNVIII: Grossly normal CNIX: Normal palate movement CNXI: Trap and SCM strength normal CN XII: Tongue protrusion normal Sensation grossly intact to LT DTR: Normal Coordination (finger/nose & heel/shin): Normal  Results -  Imaging/Labs   MRI of the brain dated 07/07/2017 was again reviewed.  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  There is small superiorly and anteriorly located lobulated enhancement.  Differential diagnosis includes craniopharyngioma or possibly Rathke's cleft cyst, with cystic pituitary adenoma may be less likely.   Impression/Plan   42 y.o. female with cystic sellar/suprasellar mass and some endocrinopathy although normal vision.  There is radiographic compression of the optic apparatus.  With increasing headaches in the size of this lesion, surgical resection via transsphenoidal approach was felt to be appropriate.   She presents today for endoscopic assisted transsphenoidal resection of pituitary lesion with  Co-surgeon Dr. Wilburn Cornelia of ENT.  While in the office, risks, benefits and alternatives of the surgery were discussed.  Patient states in own language understanding of the procedure and wishes to proceed.  Consent has been signed.

## 2018-02-01 ENCOUNTER — Inpatient Hospital Stay (HOSPITAL_COMMUNITY)
Admission: RE | Disposition: A | Discharge status: Home / Self Care | Payer: Self-pay | Source: Ambulatory Visit | Attending: Neurosurgery

## 2018-02-01 ENCOUNTER — Inpatient Hospital Stay (HOSPITAL_COMMUNITY)
Admission: RE | Admit: 2018-02-01 | Discharge: 2018-02-02 | DRG: 989 | Disposition: A | Discharge status: Home / Self Care | Payer: BLUE CROSS/BLUE SHIELD | Source: Ambulatory Visit | Attending: Neurosurgery | Admitting: Neurosurgery

## 2018-02-01 ENCOUNTER — Encounter (HOSPITAL_COMMUNITY): Payer: Self-pay | Admitting: Anesthesiology

## 2018-02-01 ENCOUNTER — Inpatient Hospital Stay (HOSPITAL_COMMUNITY): Payer: BLUE CROSS/BLUE SHIELD | Admitting: Emergency Medicine

## 2018-02-01 ENCOUNTER — Inpatient Hospital Stay (HOSPITAL_COMMUNITY): Payer: BLUE CROSS/BLUE SHIELD | Admitting: Anesthesiology

## 2018-02-01 DIAGNOSIS — Z539 Procedure and treatment not carried out, unspecified reason: Secondary | ICD-10-CM | POA: Diagnosis present

## 2018-02-01 DIAGNOSIS — R358 Other polyuria: Secondary | ICD-10-CM | POA: Diagnosis present

## 2018-02-01 DIAGNOSIS — J342 Deviated nasal septum: Secondary | ICD-10-CM | POA: Diagnosis present

## 2018-02-01 DIAGNOSIS — E230 Hypopituitarism: Secondary | ICD-10-CM | POA: Diagnosis present

## 2018-02-01 DIAGNOSIS — E7800 Pure hypercholesterolemia, unspecified: Secondary | ICD-10-CM | POA: Diagnosis present

## 2018-02-01 DIAGNOSIS — R631 Polydipsia: Secondary | ICD-10-CM | POA: Diagnosis present

## 2018-02-01 DIAGNOSIS — Z79899 Other long term (current) drug therapy: Secondary | ICD-10-CM | POA: Diagnosis not present

## 2018-02-01 DIAGNOSIS — E039 Hypothyroidism, unspecified: Secondary | ICD-10-CM | POA: Diagnosis present

## 2018-02-01 DIAGNOSIS — F17210 Nicotine dependence, cigarettes, uncomplicated: Secondary | ICD-10-CM | POA: Diagnosis present

## 2018-02-01 DIAGNOSIS — E236 Other disorders of pituitary gland: Secondary | ICD-10-CM | POA: Diagnosis present

## 2018-02-01 DIAGNOSIS — J343 Hypertrophy of nasal turbinates: Secondary | ICD-10-CM | POA: Diagnosis present

## 2018-02-01 DIAGNOSIS — J341 Cyst and mucocele of nose and nasal sinus: Secondary | ICD-10-CM | POA: Diagnosis present

## 2018-02-01 DIAGNOSIS — D352 Benign neoplasm of pituitary gland: Secondary | ICD-10-CM | POA: Diagnosis present

## 2018-02-01 HISTORY — PX: TRANSPHENOIDAL APPROACH EXPOSURE: SHX6311

## 2018-02-01 HISTORY — PX: NASAL SEPTOPLASTY W/ TURBINOPLASTY: SHX2070

## 2018-02-01 HISTORY — PX: CRANIOTOMY: SHX93

## 2018-02-01 LAB — SODIUM: SODIUM: 148 mmol/L — AB (ref 135–145)

## 2018-02-01 SURGERY — CRANIOTOMY HYPOPHYSECTOMY TRANSNASAL APPROACH
Anesthesia: General | Site: Head

## 2018-02-01 MED ORDER — ALBUMIN HUMAN 5 % IV SOLN
INTRAVENOUS | Status: DC | PRN
  Administered 2018-02-01: 11:00:00 via INTRAVENOUS

## 2018-02-01 MED ORDER — OXYCODONE HCL 5 MG PO TABS: 5.0000 mg | ORAL_TABLET | Freq: Once | ORAL | Status: DC | PRN

## 2018-02-01 MED ORDER — OXYCODONE-ACETAMINOPHEN 5-325 MG PO TABS
1.0000 | ORAL_TABLET | ORAL | Status: DC | PRN
  Administered 2018-02-01 – 2018-02-02 (×3): 1 via ORAL
  Filled 2018-02-01 (×3): qty 1

## 2018-02-01 MED ORDER — DEXAMETHASONE SODIUM PHOSPHATE 10 MG/ML IJ SOLN
10.0000 mg | Freq: Once | INTRAMUSCULAR | Status: AC
  Administered 2018-02-01: 10 mg via INTRAVENOUS

## 2018-02-01 MED ORDER — FENTANYL CITRATE (PF) 250 MCG/5ML IJ SOLN
INTRAMUSCULAR | Status: DC | PRN
  Administered 2018-02-01: 50 ug via INTRAVENOUS
  Administered 2018-02-01: 75 ug via INTRAVENOUS
  Administered 2018-02-01: 25 ug via INTRAVENOUS
  Administered 2018-02-01: 50 ug via INTRAVENOUS
  Administered 2018-02-01: 100 ug via INTRAVENOUS
  Administered 2018-02-01: 25 ug via INTRAVENOUS
  Administered 2018-02-01: 100 ug via INTRAVENOUS
  Administered 2018-02-01: 25 ug via INTRAVENOUS
  Administered 2018-02-01: 50 ug via INTRAVENOUS

## 2018-02-01 MED ORDER — ROCURONIUM BROMIDE 10 MG/ML (PF) SYRINGE
PREFILLED_SYRINGE | INTRAVENOUS | Status: DC | PRN
  Administered 2018-02-01: 10 mg via INTRAVENOUS
  Administered 2018-02-01: 20 mg via INTRAVENOUS
  Administered 2018-02-01: 50 mg via INTRAVENOUS
  Administered 2018-02-01: 20 mg via INTRAVENOUS

## 2018-02-01 MED ORDER — 0.9 % SODIUM CHLORIDE (POUR BTL) OPTIME
TOPICAL | Status: DC | PRN
  Administered 2018-02-01: 1000 mL

## 2018-02-01 MED ORDER — ONDANSETRON HCL 4 MG PO TABS: 4.0000 mg | ORAL_TABLET | ORAL | Status: DC | PRN

## 2018-02-01 MED ORDER — MUPIROCIN CALCIUM 2 % EX CREA
TOPICAL_CREAM | CUTANEOUS | Status: DC | PRN
  Administered 2018-02-01: 1 via TOPICAL

## 2018-02-01 MED ORDER — PROMETHAZINE HCL 25 MG PO TABS: 12.5000 mg | ORAL_TABLET | ORAL | Status: DC | PRN

## 2018-02-01 MED ORDER — PROPOFOL 10 MG/ML IV BOLUS
INTRAVENOUS | Status: DC | PRN
  Administered 2018-02-01: 20 mg via INTRAVENOUS
  Administered 2018-02-01: 10 mg via INTRAVENOUS
  Administered 2018-02-01: 200 mg via INTRAVENOUS
  Administered 2018-02-01: 10 mg via INTRAVENOUS

## 2018-02-01 MED ORDER — LIDOCAINE-EPINEPHRINE 1 %-1:100000 IJ SOLN
INTRAMUSCULAR | Status: AC
  Filled 2018-02-01: qty 1

## 2018-02-01 MED ORDER — OXYMETAZOLINE HCL 0.05 % NA SOLN
1.0000 | Freq: Two times a day (BID) | NASAL | Status: DC
  Administered 2018-02-01: 1 via NASAL

## 2018-02-01 MED ORDER — CEPHALEXIN 500 MG PO CAPS: 500.0000 mg | ORAL_CAPSULE | Freq: Three times a day (TID) | ORAL | 1 refills | Status: DC

## 2018-02-01 MED ORDER — ROCURONIUM BROMIDE 50 MG/5ML IV SOSY
PREFILLED_SYRINGE | INTRAVENOUS | Status: AC
  Filled 2018-02-01: qty 5

## 2018-02-01 MED ORDER — SODIUM CHLORIDE 0.9 % IV SOLN
INTRAVENOUS | Status: DC | PRN
  Administered 2018-02-01 (×3): via INTRAVENOUS

## 2018-02-01 MED ORDER — CHLORHEXIDINE GLUCONATE CLOTH 2 % EX PADS: 6.0000 | MEDICATED_PAD | Freq: Once | CUTANEOUS | Status: DC

## 2018-02-01 MED ORDER — FAMOTIDINE 20 MG PO TABS
20.0000 mg | ORAL_TABLET | Freq: Every day | ORAL | Status: DC
  Administered 2018-02-02: 20 mg via ORAL
  Filled 2018-02-01: qty 1

## 2018-02-01 MED ORDER — SODIUM CHLORIDE 0.9 % IV SOLN
INTRAVENOUS | Status: DC | PRN
  Administered 2018-02-01: 20 ug/min via INTRAVENOUS

## 2018-02-01 MED ORDER — LIDOCAINE 2% (20 MG/ML) 5 ML SYRINGE
INTRAMUSCULAR | Status: AC
  Filled 2018-02-01: qty 5

## 2018-02-01 MED ORDER — BISACODYL 10 MG RE SUPP: 10.0000 mg | Freq: Every day | RECTAL | Status: DC | PRN

## 2018-02-01 MED ORDER — ONDANSETRON HCL 4 MG/2ML IJ SOLN
INTRAMUSCULAR | Status: AC
  Filled 2018-02-01: qty 2

## 2018-02-01 MED ORDER — OXYMETAZOLINE HCL 0.05 % NA SOLN
NASAL | Status: AC
  Filled 2018-02-01: qty 15

## 2018-02-01 MED ORDER — THROMBIN (RECOMBINANT) 20000 UNITS EX SOLR
CUTANEOUS | Status: AC
  Filled 2018-02-01: qty 20000

## 2018-02-01 MED ORDER — DEXAMETHASONE SODIUM PHOSPHATE 10 MG/ML IJ SOLN
INTRAMUSCULAR | Status: AC
  Filled 2018-02-01: qty 1

## 2018-02-01 MED ORDER — MORPHINE SULFATE (PF) 2 MG/ML IV SOLN
2.0000 mg | Freq: Once | INTRAVENOUS | Status: AC
  Administered 2018-02-01: 2 mg via INTRAVENOUS
  Filled 2018-02-01: qty 1

## 2018-02-01 MED ORDER — MIDAZOLAM HCL 2 MG/2ML IJ SOLN
INTRAMUSCULAR | Status: AC
  Filled 2018-02-01: qty 2

## 2018-02-01 MED ORDER — CEPHALEXIN 500 MG PO CAPS
500.0000 mg | ORAL_CAPSULE | Freq: Three times a day (TID) | ORAL | Status: DC
  Administered 2018-02-01 – 2018-02-02 (×4): 500 mg via ORAL
  Filled 2018-02-01 (×5): qty 1

## 2018-02-01 MED ORDER — TRIAMCINOLONE ACETONIDE 40 MG/ML IJ SUSP
INTRAMUSCULAR | Status: AC
  Filled 2018-02-01: qty 5

## 2018-02-01 MED ORDER — PHENYLEPHRINE 40 MCG/ML (10ML) SYRINGE FOR IV PUSH (FOR BLOOD PRESSURE SUPPORT)
PREFILLED_SYRINGE | INTRAVENOUS | Status: DC | PRN
  Administered 2018-02-01 (×4): 40 ug via INTRAVENOUS

## 2018-02-01 MED ORDER — HYDROMORPHONE HCL 1 MG/ML IJ SOLN
INTRAMUSCULAR | Status: AC
  Filled 2018-02-01: qty 1

## 2018-02-01 MED ORDER — SUGAMMADEX SODIUM 200 MG/2ML IV SOLN
INTRAVENOUS | Status: DC | PRN
  Administered 2018-02-01: 200 mg via INTRAVENOUS

## 2018-02-01 MED ORDER — SODIUM CHLORIDE 0.45 % IV SOLN
INTRAVENOUS | Status: DC
  Administered 2018-02-01 – 2018-02-02 (×2): via INTRAVENOUS
  Administered 2018-02-02: 1 mL via INTRAVENOUS

## 2018-02-01 MED ORDER — ONDANSETRON HCL 4 MG/2ML IJ SOLN
4.0000 mg | INTRAMUSCULAR | Status: DC | PRN
  Administered 2018-02-01 (×2): 4 mg via INTRAVENOUS
  Filled 2018-02-01 (×2): qty 2

## 2018-02-01 MED ORDER — DESMOPRESSIN ACETATE 0.2 MG PO TABS
0.2000 mg | ORAL_TABLET | Freq: Every day | ORAL | Status: DC
  Administered 2018-02-01: 0.2 mg via ORAL
  Filled 2018-02-01 (×2): qty 1

## 2018-02-01 MED ORDER — FLUTICASONE PROPIONATE 50 MCG/ACT NA SUSP
2.0000 | Freq: Every day | NASAL | Status: DC
  Administered 2018-02-02: 2 via NASAL
  Filled 2018-02-01: qty 16

## 2018-02-01 MED ORDER — PREDNISONE 20 MG PO TABS
20.0000 mg | ORAL_TABLET | Freq: Two times a day (BID) | ORAL | Status: DC
  Administered 2018-02-01 – 2018-02-02 (×3): 20 mg via ORAL
  Filled 2018-02-01 (×3): qty 1

## 2018-02-01 MED ORDER — BISACODYL 5 MG PO TBEC: 5.0000 mg | DELAYED_RELEASE_TABLET | Freq: Every day | ORAL | Status: DC | PRN

## 2018-02-01 MED ORDER — ADULT MULTIVITAMIN W/MINERALS CH
1.0000 | ORAL_TABLET | Freq: Every day | ORAL | Status: DC
  Administered 2018-02-02: 1 via ORAL
  Filled 2018-02-01 (×2): qty 1

## 2018-02-01 MED ORDER — HYDROMORPHONE HCL 1 MG/ML IJ SOLN
0.2500 mg | INTRAMUSCULAR | Status: DC | PRN
  Administered 2018-02-01 (×3): 0.5 mg via INTRAVENOUS

## 2018-02-01 MED ORDER — ONDANSETRON HCL 4 MG PO TABS: 4.0000 mg | ORAL_TABLET | Freq: Four times a day (QID) | ORAL | Status: DC | PRN

## 2018-02-01 MED ORDER — PROMETHAZINE HCL 25 MG/ML IJ SOLN: 6.2500 mg | INTRAMUSCULAR | Status: DC | PRN

## 2018-02-01 MED ORDER — CEFAZOLIN SODIUM-DEXTROSE 2-4 GM/100ML-% IV SOLN
2.0000 g | Freq: Three times a day (TID) | INTRAVENOUS | Status: AC
  Administered 2018-02-01 (×2): 2 g via INTRAVENOUS
  Filled 2018-02-01 (×2): qty 100

## 2018-02-01 MED ORDER — SENNOSIDES-DOCUSATE SODIUM 8.6-50 MG PO TABS: 1.0000 | ORAL_TABLET | Freq: Every evening | ORAL | Status: DC | PRN

## 2018-02-01 MED ORDER — MUPIROCIN CALCIUM 2 % EX CREA
TOPICAL_CREAM | CUTANEOUS | Status: AC
  Filled 2018-02-01: qty 15

## 2018-02-01 MED ORDER — LIDOCAINE 2% (20 MG/ML) 5 ML SYRINGE
INTRAMUSCULAR | Status: DC | PRN
  Administered 2018-02-01: 60 mg via INTRAVENOUS
  Administered 2018-02-01: 20 mg via INTRAVENOUS

## 2018-02-01 MED ORDER — HYDROCORTISONE NA SUCCINATE PF 100 MG IJ SOLR
INTRAMUSCULAR | Status: DC | PRN
  Administered 2018-02-01: 100 mg via INTRAVENOUS

## 2018-02-01 MED ORDER — LABETALOL HCL 5 MG/ML IV SOLN: 10.0000 mg | INTRAVENOUS | Status: DC | PRN

## 2018-02-01 MED ORDER — SODIUM CHLORIDE 0.9 % IV SOLN
INTRAVENOUS | Status: DC
  Administered 2018-02-01: 15:00:00 via INTRAVENOUS

## 2018-02-01 MED ORDER — VALACYCLOVIR HCL 500 MG PO TABS
1000.0000 mg | ORAL_TABLET | Freq: Every day | ORAL | Status: DC
  Administered 2018-02-02: 1000 mg via ORAL
  Filled 2018-02-01: qty 2

## 2018-02-01 MED ORDER — GABAPENTIN 300 MG PO CAPS
300.0000 mg | ORAL_CAPSULE | Freq: Two times a day (BID) | ORAL | Status: DC
  Administered 2018-02-01 – 2018-02-02 (×2): 300 mg via ORAL
  Filled 2018-02-01 (×2): qty 1

## 2018-02-01 MED ORDER — PROPOFOL 10 MG/ML IV BOLUS
INTRAVENOUS | Status: AC
  Filled 2018-02-01: qty 20

## 2018-02-01 MED ORDER — DESMOPRESSIN ACETATE 0.1 MG PO TABS
0.1000 mg | ORAL_TABLET | Freq: Every day | ORAL | Status: DC
  Filled 2018-02-01: qty 1

## 2018-02-01 MED ORDER — EPHEDRINE 5 MG/ML INJ
INTRAVENOUS | Status: AC
  Filled 2018-02-01: qty 10

## 2018-02-01 MED ORDER — OXYCODONE HCL 5 MG/5ML PO SOLN: 5.0000 mg | Freq: Once | ORAL | Status: DC | PRN

## 2018-02-01 MED ORDER — SODIUM CHLORIDE 0.9 % IV SOLN
INTRAVENOUS | Status: AC | PRN
  Administered 2018-02-01 (×2): 1000 mL

## 2018-02-01 MED ORDER — LOPERAMIDE HCL 2 MG PO CAPS: 2.0000 mg | ORAL_CAPSULE | ORAL | Status: DC | PRN

## 2018-02-01 MED ORDER — KETOTIFEN FUMARATE 0.025 % OP SOLN
1.0000 [drp] | Freq: Every day | OPHTHALMIC | Status: DC | PRN
  Filled 2018-02-01: qty 5

## 2018-02-01 MED ORDER — OXYMETAZOLINE HCL 0.05 % NA SOLN
NASAL | Status: DC | PRN
  Administered 2018-02-01: 1 via TOPICAL

## 2018-02-01 MED ORDER — PHENYLEPHRINE 40 MCG/ML (10ML) SYRINGE FOR IV PUSH (FOR BLOOD PRESSURE SUPPORT)
PREFILLED_SYRINGE | INTRAVENOUS | Status: AC
  Filled 2018-02-01: qty 10

## 2018-02-01 MED ORDER — GLYCOPYRROLATE PF 0.2 MG/ML IJ SOSY
PREFILLED_SYRINGE | INTRAMUSCULAR | Status: DC | PRN
  Administered 2018-02-01: .2 mg via INTRAVENOUS

## 2018-02-01 MED ORDER — SUCCINYLCHOLINE CHLORIDE 200 MG/10ML IV SOSY
PREFILLED_SYRINGE | INTRAVENOUS | Status: AC
  Filled 2018-02-01: qty 10

## 2018-02-01 MED ORDER — CEFAZOLIN SODIUM-DEXTROSE 2-4 GM/100ML-% IV SOLN: 2.0000 g | INTRAVENOUS | Status: DC

## 2018-02-01 MED ORDER — SALINE SPRAY 0.65 % NA SOLN
4.0000 | NASAL | Status: DC | PRN
  Filled 2018-02-01: qty 44

## 2018-02-01 MED ORDER — LEVOTHYROXINE SODIUM 50 MCG PO TABS
50.0000 ug | ORAL_TABLET | Freq: Every day | ORAL | Status: DC
  Administered 2018-02-02: 50 ug via ORAL
  Filled 2018-02-01: qty 1

## 2018-02-01 MED ORDER — FENTANYL CITRATE (PF) 250 MCG/5ML IJ SOLN
INTRAMUSCULAR | Status: AC
  Filled 2018-02-01: qty 5

## 2018-02-01 MED ORDER — SODIUM CHLORIDE 0.9 % IV SOLN
INTRAVENOUS | Status: DC | PRN
  Administered 2018-02-01: 07:00:00 via INTRAVENOUS

## 2018-02-01 MED ORDER — HEPARIN SODIUM (PORCINE) 5000 UNIT/ML IJ SOLN
5000.0000 [IU] | Freq: Three times a day (TID) | INTRAMUSCULAR | Status: DC
  Administered 2018-02-02: 5000 [IU] via SUBCUTANEOUS
  Filled 2018-02-01: qty 1

## 2018-02-01 MED ORDER — CEFAZOLIN SODIUM-DEXTROSE 2-4 GM/100ML-% IV SOLN
INTRAVENOUS | Status: AC
  Filled 2018-02-01: qty 100

## 2018-02-01 MED ORDER — HEMOSTATIC AGENTS (NO CHARGE) OPTIME
TOPICAL | Status: DC | PRN
  Administered 2018-02-01: 1
  Administered 2018-02-01 (×2): 1 via TOPICAL
  Administered 2018-02-01: 1
  Administered 2018-02-01: 1 via TOPICAL

## 2018-02-01 MED ORDER — CEFAZOLIN SODIUM-DEXTROSE 2-4 GM/100ML-% IV SOLN
2.0000 g | INTRAVENOUS | Status: AC
  Administered 2018-02-01: 2 g via INTRAVENOUS

## 2018-02-01 MED ORDER — VITAMIN D 1000 UNITS PO TABS
2000.0000 [IU] | ORAL_TABLET | Freq: Every day | ORAL | Status: DC
  Administered 2018-02-02: 2000 [IU] via ORAL
  Filled 2018-02-01: qty 2

## 2018-02-01 MED ORDER — SUCCINYLCHOLINE CHLORIDE 200 MG/10ML IV SOSY
PREFILLED_SYRINGE | INTRAVENOUS | Status: DC | PRN
  Administered 2018-02-01: 100 mg via INTRAVENOUS

## 2018-02-01 MED ORDER — HYDROCODONE-ACETAMINOPHEN 5-325 MG PO TABS
1.0000 | ORAL_TABLET | ORAL | Status: DC | PRN
  Administered 2018-02-01 – 2018-02-02 (×2): 1 via ORAL
  Filled 2018-02-01 (×2): qty 1

## 2018-02-01 SURGICAL SUPPLY — 117 items
ATTRACTOMAT 16X20 MAGNETIC DRP (DRAPES) IMPLANT
BAG DECANTER FOR FLEXI CONT (MISCELLANEOUS) IMPLANT
BENZOIN TINCTURE PRP APPL 2/3 (GAUZE/BANDAGES/DRESSINGS) IMPLANT
BLADE ROTATE RAD 40 4 M4 (BLADE) ×3 IMPLANT
BLADE ROTATE TRICUT 4X13 M4 (BLADE) ×3 IMPLANT
BLADE SURG 10 STRL SS (BLADE) IMPLANT
BLADE SURG 11 STRL SS (BLADE) ×3 IMPLANT
BLADE SURG 15 STRL LF DISP TIS (BLADE) ×4 IMPLANT
BLADE SURG 15 STRL SS (BLADE) ×2
BUR DIAMOND 13X5 70D (BURR) IMPLANT
BUR DIAMOND CURV 15X5 15D (BURR) IMPLANT
CABLE BIPOLOR RESECTION CORD (MISCELLANEOUS) ×3 IMPLANT
CANISTER SUCT 3000ML PPV (MISCELLANEOUS) ×6 IMPLANT
CARTRIDGE OIL MAESTRO DRILL (MISCELLANEOUS) IMPLANT
COAGULATOR SUCT 8FR VV (MISCELLANEOUS) IMPLANT
CONT SPEC STER OR (MISCELLANEOUS) ×3 IMPLANT
COVER BACK TABLE 60X90IN (DRAPES) IMPLANT
COVER MAYO STAND STRL (DRAPES) ×3 IMPLANT
DIFFUSER DRILL AIR PNEUMATIC (MISCELLANEOUS) IMPLANT
DRAPE HALF SHEET 40X57 (DRAPES) ×3 IMPLANT
DRAPE INCISE IOBAN 66X45 STRL (DRAPES) ×3 IMPLANT
DRAPE MICROSCOPE LEICA (MISCELLANEOUS) IMPLANT
DRAPE POUCH INSTRU U-SHP 10X18 (DRAPES) IMPLANT
DRAPE SURG 17X23 STRL (DRAPES) IMPLANT
DRESSING NASAL POPE 10X1.5X2.5 (GAUZE/BANDAGES/DRESSINGS) IMPLANT
DRSG NASAL POPE 10X1.5X2.5 (GAUZE/BANDAGES/DRESSINGS)
DRSG NASOPORE 8CM (GAUZE/BANDAGES/DRESSINGS) ×3 IMPLANT
DURAPREP 26ML APPLICATOR (WOUND CARE) ×3 IMPLANT
ELECT COATED BLADE 2.86 ST (ELECTRODE) IMPLANT
ELECT NEEDLE TIP 2.8 STRL (NEEDLE) IMPLANT
ELECT REM PT RETURN 9FT ADLT (ELECTROSURGICAL) ×3
ELECTRODE REM PT RTRN 9FT ADLT (ELECTROSURGICAL) ×2 IMPLANT
GAUZE PACKING FOLDED 2  STR (GAUZE/BANDAGES/DRESSINGS)
GAUZE PACKING FOLDED 2 STR (GAUZE/BANDAGES/DRESSINGS) IMPLANT
GAUZE SPONGE 2X2 8PLY STRL LF (GAUZE/BANDAGES/DRESSINGS) IMPLANT
GAUZE SPONGE 4X4 12PLY STRL (GAUZE/BANDAGES/DRESSINGS) IMPLANT
GAUZE SPONGE 4X4 16PLY XRAY LF (GAUZE/BANDAGES/DRESSINGS) ×3 IMPLANT
GLOVE BIO SURGEON STRL SZ7 (GLOVE) IMPLANT
GLOVE BIOGEL M 7.0 STRL (GLOVE) IMPLANT
GLOVE BIOGEL PI IND STRL 6.5 (GLOVE) ×4 IMPLANT
GLOVE BIOGEL PI IND STRL 7.0 (GLOVE) IMPLANT
GLOVE BIOGEL PI IND STRL 7.5 (GLOVE) IMPLANT
GLOVE BIOGEL PI IND STRL 8 (GLOVE) ×4 IMPLANT
GLOVE BIOGEL PI INDICATOR 6.5 (GLOVE) ×2
GLOVE BIOGEL PI INDICATOR 7.0 (GLOVE)
GLOVE BIOGEL PI INDICATOR 7.5 (GLOVE)
GLOVE BIOGEL PI INDICATOR 8 (GLOVE) ×2
GLOVE ECLIPSE 7.0 STRL STRAW (GLOVE) IMPLANT
GLOVE EXAM NITRILE LRG STRL (GLOVE) IMPLANT
GLOVE EXAM NITRILE XL STR (GLOVE) IMPLANT
GLOVE EXAM NITRILE XS STR PU (GLOVE) IMPLANT
GLOVE SURG SS PI 6.5 STRL IVOR (GLOVE) ×6 IMPLANT
GLOVE SURG SS PI 7.0 STRL IVOR (GLOVE) ×12 IMPLANT
GOWN STRL REUS W/ TWL LRG LVL3 (GOWN DISPOSABLE) ×4 IMPLANT
GOWN STRL REUS W/ TWL XL LVL3 (GOWN DISPOSABLE) IMPLANT
GOWN STRL REUS W/TWL 2XL LVL3 (GOWN DISPOSABLE) ×3 IMPLANT
GOWN STRL REUS W/TWL LRG LVL3 (GOWN DISPOSABLE) ×2
GOWN STRL REUS W/TWL XL LVL3 (GOWN DISPOSABLE)
HEMOSTAT POWDER KIT SURGIFOAM (HEMOSTASIS) IMPLANT
HEMOSTAT SURGICEL 2X14 (HEMOSTASIS) IMPLANT
KIT BASIN OR (CUSTOM PROCEDURE TRAY) ×6 IMPLANT
KIT DRAIN CSF ACCUDRAIN (MISCELLANEOUS) IMPLANT
KIT TURNOVER KIT B (KITS) ×3 IMPLANT
NEEDLE 18GX1X1/2 (RX/OR ONLY) (NEEDLE) IMPLANT
NEEDLE HYPO 25GX1X1/2 BEV (NEEDLE) IMPLANT
NEEDLE HYPO 25X1 1.5 SAFETY (NEEDLE) ×3 IMPLANT
NEEDLE SPNL 22GX3.5 QUINCKE BK (NEEDLE) ×3 IMPLANT
NS IRRIG 1000ML POUR BTL (IV SOLUTION) ×3 IMPLANT
OIL CARTRIDGE MAESTRO DRILL (MISCELLANEOUS)
PAD ARMBOARD 7.5X6 YLW CONV (MISCELLANEOUS) ×6 IMPLANT
PATTIES SURGICAL .25X.25 (GAUZE/BANDAGES/DRESSINGS) IMPLANT
PATTIES SURGICAL .5 X.5 (GAUZE/BANDAGES/DRESSINGS) ×3 IMPLANT
PATTIES SURGICAL .5 X3 (DISPOSABLE) IMPLANT
PATTIES SURGICAL 1X1 (DISPOSABLE) ×3 IMPLANT
PENCIL BUTTON HOLSTER BLD 10FT (ELECTRODE) ×3 IMPLANT
RUBBERBAND STERILE (MISCELLANEOUS) IMPLANT
SHEATH ENDOSCRUB 0 DEG (SHEATH) ×3 IMPLANT
SHEATH ENDOSCRUB 45 DEG (SHEATH) ×3 IMPLANT
SOLUTION BUTLER CLEAR DIP (MISCELLANEOUS) ×3 IMPLANT
SPECIMEN JAR SMALL (MISCELLANEOUS) IMPLANT
SPLINT NASAL DOYLE BI-VL (GAUZE/BANDAGES/DRESSINGS) ×3 IMPLANT
SPONGE GAUZE 2X2 STER 10/PKG (GAUZE/BANDAGES/DRESSINGS)
SPONGE LAP 4X18 RFD (DISPOSABLE) IMPLANT
SPONGE NEURO XRAY DETECT 1X3 (DISPOSABLE) ×6 IMPLANT
SPONGE SURGIFOAM ABS GEL 100 (HEMOSTASIS) ×3 IMPLANT
SPONGE SURGIFOAM ABS GEL 12-7 (HEMOSTASIS) IMPLANT
STAPLER SKIN PROX WIDE 3.9 (STAPLE) IMPLANT
STRIP CLOSURE SKIN 1/2X4 (GAUZE/BANDAGES/DRESSINGS) IMPLANT
SURGIFLO W/THROMBIN 8M KIT (HEMOSTASIS) ×6 IMPLANT
SUT 5.0 PDS RB-1 (SUTURE)
SUT BONE WAX W31G (SUTURE) IMPLANT
SUT ETHILON 3 0 FSL (SUTURE) IMPLANT
SUT ETHILON 3 0 PS 1 (SUTURE) IMPLANT
SUT ETHILON 6 0 P 1 (SUTURE) IMPLANT
SUT PDS AB 4-0 RB1 27 (SUTURE) IMPLANT
SUT PDS PLUS AB 5-0 RB-1 (SUTURE) IMPLANT
SUT PLAIN 4 0 ~~LOC~~ 1 (SUTURE) ×3 IMPLANT
SUT VIC AB 4-0 P-3 18X BRD (SUTURE) IMPLANT
SUT VIC AB 4-0 P3 18 (SUTURE)
SYR BULB 3OZ (MISCELLANEOUS) ×3 IMPLANT
SYR CONTROL 10ML LL (SYRINGE) IMPLANT
TOWEL GREEN STERILE (TOWEL DISPOSABLE) ×3 IMPLANT
TOWEL GREEN STERILE FF (TOWEL DISPOSABLE) IMPLANT
TOWEL NATURAL 6PK STERILE (DISPOSABLE) IMPLANT
TOWEL OR 17X24 6PK STRL BLUE (TOWEL DISPOSABLE) ×3 IMPLANT
TRACKER ENT INSTRUMENT (MISCELLANEOUS) ×3 IMPLANT
TRACKER ENT PATIENT (MISCELLANEOUS) ×3 IMPLANT
TRAP SPECIMEN MUCOUS 40CC (MISCELLANEOUS) IMPLANT
TRAY ENT MC OR (CUSTOM PROCEDURE TRAY) ×3 IMPLANT
TRAY FOL W/BAG SLVR 16FR STRL (SET/KITS/TRAYS/PACK) ×2 IMPLANT
TRAY FOLEY MTR SLVR 16FR STAT (SET/KITS/TRAYS/PACK) IMPLANT
TRAY FOLEY W/BAG SLVR 16FR LF (SET/KITS/TRAYS/PACK) ×1
TUBE CONNECTING 12X1/4 (SUCTIONS) ×3 IMPLANT
TUBE SALEM SUMP 16 FR W/ARV (TUBING) ×3 IMPLANT
TUBING EXTENTION W/L.L. (IV SETS) ×3 IMPLANT
TUBING STRAIGHTSHOT EPS 5PK (TUBING) ×3 IMPLANT
WATER STERILE IRR 1000ML POUR (IV SOLUTION) ×3 IMPLANT

## 2018-02-01 NOTE — Anesthesia Procedure Notes (Signed)
Arterial Line Insertion Start/End8/16/2019 7:08 AM Performed by: Murvin Natal, MD, Marsa Aris, CRNA, CRNA  Patient location: Pre-op. Preanesthetic checklist: patient identified, IV checked, risks and benefits discussed, surgical consent, monitors and equipment checked, pre-op evaluation, timeout performed and anesthesia consent Lidocaine 1% used for infiltration Left, radial was placed Catheter size: 20 G Hand hygiene performed  and maximum sterile barriers used  Allen's test indicative of satisfactory collateral circulation Attempts: 1 Procedure performed without using ultrasound guided technique. Following insertion, dressing applied and Biopatch. Post procedure assessment: normal  Patient tolerated the procedure well with no immediate complications.

## 2018-02-01 NOTE — Anesthesia Postprocedure Evaluation (Signed)
Anesthesia Post Note  Patient: [employer]  Procedure(s) Performed: CRANIOTOMY TRANSPHENOIDAL RESECTION OF PITUITARY TUMOR (N/A Head) TRANSPHENOIDAL APPROACH EXPOSURE WITH FUSION PROTOCOL (N/A ) POSSIBLE  NASAL SEPTOPLASTY WITH TURBINATE REDUCTION CONSIDER ENDOSCOPIC  EXCISION OF LEFT CONCHA BULLOSA AND REMOVAL OF LEFT MAXILLARY SINUS MUCOUS RETENTION CYST. (N/A )     Patient location during evaluation: PACU Anesthesia Type: General Level of consciousness: awake and alert Pain management: pain level controlled Vital Signs Assessment: post-procedure vital signs reviewed and stable Respiratory status: spontaneous breathing, nonlabored ventilation, respiratory function stable and patient connected to nasal cannula oxygen Cardiovascular status: blood pressure returned to baseline and stable Postop Assessment: no apparent nausea or vomiting Anesthetic complications: no    Last Vitals:  Vitals:   02/01/18 1420 02/01/18 1436  BP: 114/84 124/75  Pulse: (!) 50 (!) 56  Resp: 11 14  Temp: (!) 36.4 C   SpO2: 96% 96%    Last Pain:  Vitals:   02/01/18 1430  TempSrc:   PainSc: 6                  Ryan P Ellender

## 2018-02-01 NOTE — Anesthesia Preprocedure Evaluation (Addendum)
Anesthesia Evaluation  Patient identified by MRN, date of birth, ID band Patient awake    Reviewed: Allergy & Precautions, NPO status , Patient's Chart, lab work & pertinent test results  Airway Mallampati: II  TM Distance: >3 FB Neck ROM: Full    Dental  (+) Chipped,    Pulmonary Current Smoker,    Pulmonary exam normal breath sounds clear to auscultation       Cardiovascular negative cardio ROS Normal cardiovascular exam Rhythm:Regular Rate:Normal  ECG: SB, PAC's, rate 57   Neuro/Psych  Headaches, Anxiety 1. Known sellar and suprasellar cystic mass with 2 mm of growth since January 2019 and a new apparent daughter cyst in the left hypothalamic region. Craniopharyngioma remains the favored pathology, as above. A CSF cleft is present between the anterior wall of the sella and the mass itself. 2. 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 The mass contacts the A1 segments and basilar terminus. Mass effect on the third ventricular floor without hydrocephalus.     GI/Hepatic Neg liver ROS, IBS (irritable bowel syndrome)   Endo/Other  Hypothyroidism   Renal/GU negative Renal ROS     Musculoskeletal Low back pain   Abdominal   Peds  Hematology HLD   Anesthesia Other Findings SUPRASELLAR MASS  Reproductive/Obstetrics S/p hysterectomy                           Anesthesia Physical Anesthesia Plan  ASA: III  Anesthesia Plan: General   Post-op Pain Management:    Induction: Intravenous  PONV Risk Score and Plan: 2 and Dexamethasone, Ondansetron and Treatment may vary due to age or medical condition  Airway Management Planned: Oral ETT  Additional Equipment: Arterial line  Intra-op Plan:   Post-operative Plan: Extubation in OR and Possible Post-op intubation/ventilation  Informed Consent: I have reviewed the patients History and Physical, chart, labs and discussed the procedure including  the risks, benefits and alternatives for the proposed anesthesia with the patient or authorized representative who has indicated his/her understanding and acceptance.   Dental advisory given  Plan Discussed with: CRNA  Anesthesia Plan Comments:         Anesthesia Quick Evaluation

## 2018-02-01 NOTE — Progress Notes (Signed)
Physician on call made aware of increase urine output with a specific gravity of 1.001. 2200 dose of DDAVP given, will continue to monitor.

## 2018-02-01 NOTE — Consult Note (Signed)
ENT CONSULT:  Reason for Consult: Pituitary mass Referring Physician: Dr. [name] is an 42 y.o. female.  HPI: The patient presents to [facility] for endoscopic transsphenoidal pituitary resection.  She has a history of known pituitary mass which is been monitored over time.  Gradual increase in size with concerns regarding headache and visual change.  Patient was evaluated by Dr. Kathyrn Lamb and surgical resection recommended.  Patient seen preoperatively at [facility] ENT with CT scan of the sinuses.  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  Past Medical History:  Diagnosis Date  . Anemia   . Anxiety   . 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 Dyspnea    not normally  . Dysrhythmia    palpitations occasionally with chest pain (stabbing pain)   PAC's  . High cholesterol   . Hypothyroidism   . IBS (irritable bowel syndrome)   . Low back pain    2 bulging discs  . Migraines   . Spinal stenosis    Mild    Past Surgical History:  Procedure Laterality Date  . ABDOMINAL HYSTERECTOMY    . CYSTECTOMY  2006  . DILATION AND CURETTAGE OF UTERUS    . ORIF ANKLE FRACTURE BIMALLEOLAR  2010  . PARTIAL HYSTERECTOMY  2006  . wisdom teeth removal      Family History  Problem Relation Age of Onset  . Prostate cancer Maternal Grandfather   . Hypertension Maternal Grandfather   . High Cholesterol Maternal Grandfather   . Hypertension Maternal Grandmother   . High Cholesterol Maternal Grandmother   . Other Neg Hx        pituitary disorder    Social History:  reports that she has been smoking cigarettes. She has a 4.50 pack-year smoking history. She has never used smokeless tobacco. She reports that she does not drink alcohol or use drugs.  Allergies:  Allergies  Allergen Reactions  . Latex Rash    Medications: I have reviewed the patient's current medications.  No  results found for this or any previous visit (from the past 48 hour(s)).  No results found.  ROS:ROS  Blood pressure 131/84, pulse (!) 52, temperature 98.4 F (36.9 C), temperature source Oral, resp. rate 20, weight 83.9 kg, SpO2 100 %.  PHYSICAL EXAM: General appearance - alert, well appearing, and in no distress Eyes - pupils equal and reactive, extraocular eye movements intact Nose -right nasal septal deviation with significant partial obstruction and bilateral inferior turbinate hypertrophy. Mouth - mucous membranes moist, pharynx normal without lesions Neck - supple, no significant adenopathy  Studies Reviewed: MRI scan brain, CT scan sinus  Assessment/Plan: Patient admitted for endoscopic transsphenoidal pituitary resection with neurosurgeon Dr. Kathyrn Lamb.  Risks and benefits of the surgical procedure were discussed in detail with the patient and her daughter.  They understand and agree with our plan for surgery which is scheduled under general anesthesia at [facility].  Patient also has sinus issues including a left maxillary sinus mucous retention cyst and deviated septum which will be addressed at the same time.  [place], Cindy Lamb 02/01/2018, 7:29 AM

## 2018-02-01 NOTE — Discharge Instructions (Signed)
Sinus/Nasal Instructions:  1. Limited activity 2. Liquid and soft diet 3. May bathe and shower 4. Saline nasal spray - 4 puffs/nostril every hour while awake 5. Elevate Head of Bed 6. No nose blowing/Open mouth sneeze  Call [facility] ENT for any questions or concerns: [phone]

## 2018-02-01 NOTE — Anesthesia Procedure Notes (Signed)
Procedure Name: Intubation Date/Time: 02/01/2018 8:08 AM Performed by: Marsa Aris, CRNA Pre-anesthesia Checklist: Patient identified, Emergency Drugs available, Suction available, Patient being monitored and Timeout performed Patient Re-evaluated:Patient Re-evaluated prior to induction Oxygen Delivery Method: Circle system utilized Preoxygenation: Pre-oxygenation with 100% oxygen Induction Type: IV induction Ventilation: Mask ventilation without difficulty Laryngoscope Size: Miller and 2 Grade View: Grade I Tube type: Oral Tube size: 7.5 mm Number of attempts: 1 Airway Equipment and Method: Stylet Placement Confirmation: ETT inserted through vocal cords under direct vision,  positive ETCO2 and breath sounds checked- equal and bilateral Secured at: 22 cm Tube secured with: Tape Dental Injury: Teeth and Oropharynx as per pre-operative assessment  Comments: No change in dentition from pre-op assessment

## 2018-02-01 NOTE — Transfer of Care (Signed)
Immediate Anesthesia Transfer of Care Note  Patient: Cindy Lamb  Procedure(s) Performed: CRANIOTOMY TRANSPHENOIDAL RESECTION OF PITUITARY TUMOR (N/A Head) TRANSPHENOIDAL APPROACH EXPOSURE WITH FUSION PROTOCOL (N/A ) POSSIBLE  NASAL SEPTOPLASTY WITH TURBINATE REDUCTION CONSIDER ENDOSCOPIC  EXCISION OF LEFT CONCHA BULLOSA AND REMOVAL OF LEFT MAXILLARY SINUS MUCOUS RETENTION CYST. (N/A )  Patient Location: PACU  Anesthesia Type:General  Level of Consciousness: awake, alert  and oriented  Airway & Oxygen Therapy: Patient Spontanous Breathing and Patient connected to face mask oxygen  Post-op Assessment: Report given to RN and Post -op Vital signs reviewed and stable  Post vital signs: Reviewed and stable  Last Vitals:  Vitals Value Taken Time  BP 117/70 02/01/2018 12:35 PM  Temp    Pulse 94 02/01/2018 12:38 PM  Resp 12 02/01/2018 12:38 PM  SpO2 99 % 02/01/2018 12:38 PM  Vitals shown include unvalidated device data.  Last Pain:  Vitals:   02/01/18 0638  TempSrc:   PainSc: 3       Patients Stated Pain Goal: 8 (21/97/58 8325)  Complications: No apparent anesthesia complications

## 2018-02-01 NOTE — Op Note (Signed)
Operative Note:  ENDOSCOPIC TRANSSPHENOIDAL PITUITARY RESECTION WITH NAVIGATION      Patient: Cindy Lamb record number: [id number]  Date:02/01/2018  Pre-operative Indications: 1.  Pituitary Mass     2.  Left Maxillary Sinus Cyst     3.  Deviated Nasal Septum     4.  Bilateral Inferior Turbinate Hypertrophy       Postoperative Indications: Same  Surgical Procedure: 1. Endoscopic transsphenoidal pituitary resection with intraoperative navigation    2.  Left endoscopic sinus surgery consisting of resection of middle turbinate concha bullosum, left total ethmoidectomy, left maxillary antrostomy with removal of sinus cyst.    3.  Nasal Septoplasty    4.  Bilateral Inferior Turbinate Reduction      Anesthesia: GET  Surgeon: Delsa Bern, M.D.  Neurosurgeon: Dr. Kathyrn Sheriff  Complications: None  EBL: 350 cc  Findings: Deviated nasal septum.  Large left middle turbinate concha bullosum.  Left maxillary sinus mucous retention cyst. Naso-pore sphenoid packing placed.  Note: The neurosurgical component of the operative procedure is dictated as a separate operative note.   Brief History: The patient is a 42 y.o. female with a history of pituitary mass. The patient has a history of headache and enlarging pituitary mass on scan. The patient was referred to Dr. Kathyrn Sheriff for neurosurgical evaluation.  Patient seen by me at [facility] preoperatively with review of nasal anatomy and sinus CT scan for navigation.  The patient was found to have a deviated septum and large left middle turbinate concha bullosum with a left maxillary sinus soft tissue mass consistent with a mucous retention cyst.  Given the patient's history and findings, the above surgical procedures were recommended, risks and benefits were discussed in detail with the patient.  They understand and agree with our plan for surgery which is scheduled at [facility] under general anesthesia.  Surgical  Procedure: The patient is brought to the neurosurgical operating room on 02/01/2018 and placed in supine position on the operating table. General endotracheal anesthesia was established without difficulty. When the patient was adequately anesthetized, surgical timeout was performed with correct identification of the patient and the surgical procedure. The patient's nose was then injected with 11 cc of 1% lidocaine 1:100,000 dilution epinephrine which was injected in a submucosal fashion. The patient's nose was then packed with Afrin-soaked cottonoid pledgets were left in place for approximately 10 minutes to allow for vasoconstriction and hemostasis.  The Xomed Fusion navigation headgear was applied and anatomic and surgical landmarks were identified and confirmed, navigation was used throughout the sinus component of the surgical procedure.  With the patient prepped draped and prepared for surgery, nasal endoscopy was performed on the patient's right.  The middle turbinate was carefully lateralized to allow access to the posterior aspect of the nasal passageway.  The right sphenoid sinus ostium was identified.  The inferior aspect of the superior turbinate was then resected with through-cutting forceps and a microdebrider.  The right sphenoid sinus ostium was enlarged in a superior and lateral direction using the microdebrider and through-cutting forceps to create a widely patent ostium.  Nasal endoscopy on the patient's left-hand side was then undertaken.  The left middle turbinate was sniffily enlarged and endoscopic resection of concha bullosa was undertaken.  The posterior nasal cavity was visualized with identification of the left sphenoid sinus ostium using navigation.  The inferior aspect of the superior turbinate was resected and the sinus ostium was enlarged in the lateral and superior direction  to create a wide sphenoid sinus ostium.  A posterior septectomy was then performed with a Surveyor, quantity.   Bone, cartilage and soft tissue was then resected to create a wide posterior septotomy.  The anterior face of the sphenoid sinus and sphenoid sinus septum were then resected with a combination of through-cutting forceps, osteotome and microdebrider to allow direct access to the entire posterior aspect of the sphenoid sinus and pituitary fossa.  Sphenoid sinus mucosa overlying the pituitary fossa was elevated and lateralized.  The anterior face of the pituitary fossa was demarcated using navigation.  The patient has a large middle turbinate concha bullosum which was resected endoscopically.  Using a sickle knife a vertical incision was created in the anterior face of the middle turbinate the lateral component was then resected with curved endoscopic scissors and through cutting forceps.  Residual mucosal thickening and bony tissue was removed using the microdebrider.  The patient's left side was then inspected and total ethmoidectomy was performed using a 0 degree telescope and straight microdebrider along the floor of the ethmoid sinus, a 45 degree telescope and curved microdebrider with navigation was used along the roof the ethmoid sinus from posterior to anterior to complete a total ethmoidectomy.  Attention was then turned to the lateral nasal wall, the natural ostium maxillary sinus was identified. The uncinate process was resected.  Large mucosal cyst from within the maxilla sinus was then cleared and the ostium was enlarged in a posterior and inferior direction.     A left anterior hemitransfixion incision was created and a mucoperichondrial flap was elevated from anterior to posterior on the left-hand side. The anterior cartilaginous septum was crossed at the midline and a mucoperichondrial flap was elevated on the patient's right.  Swivel knife was then used to resect the anterior and mid cartilaginous portion of the nasal septum.  Resected cartilage was morcellized and returned to the  mucoperichondrial pocket at the occlusion of the surgical procedure.  Dissection was then carried out from anterior to posterior removing deviated bone and cartilage including a large septal spur the overlying mucosa was preserved.  With the septum brought to good midline position, the morselized cartilage was returned to the mucoperichondrial pocket and the soft tissue/mucosal flaps were reapproximated with interrupted 4-0 gut suture on a Keith needle in a horizontal mattressing fashion.  Anterior hemitransfixion incision was closed with the same stitch.  Bilateral Doyle nasal septal splints were then placed after the application of Bactroban ointment and sutured in position with a 3-0 Ethilon suture.  Attention was then turned to the inferior turbinates, bilateral inferior turbinate intramural cautery was performed with cautery setting at 23 W.  2 submucosal passes were made in each inferior turbinate.  After completing cautery, anterior vertical incisions were created and overlying soft tissue was elevated, a small amount of turbinate bone was resected.  The turbinates were then outfractured to create a more patent nasal passageway.  Dr. Kathyrn Sheriff then began the neurosurgical component of the procedure.  This is dictated as a separate operative report.  Resection of the pituitary tumor was undertaken using direct visualization of the 0 degree endoscope, navigation and blunt and sharp dissection.  There was significant venous bleeding from the dural incision, multiple attempts were made to gain access to the tumor.  The pituitary tumor resection was not successful because of significant bleeding and the tumor resection component of the procedure was aborted.  Reconstruction was then undertaken.  Gelfoam and Surgicel were placed over the anterior  pituitary fossa and bleeding was controlled.  The sphenoid sinus was loosely packed with Naso-pore absorbable nasal packing.  There was no active bleeding and no  evidence of spinal fluid leak.  The sphenoid sinus was carefully inspected, no further bleeding along the mucosal margins, sphenoidotomy sites or posterior septectomy.  The patient's nasal cavity was irrigated and suctioned.  Surgical sponge count was correct. An oral gastric tube was passed and the stomach contents were aspirated. Patient was awakened from anesthetic and transferred from the operating room to the recovery room in stable condition. There were no complications and blood loss was 350 cc.   Delsa Bern, M.D. [facility] 02/01/2018

## 2018-02-02 ENCOUNTER — Encounter (HOSPITAL_COMMUNITY): Payer: Self-pay | Admitting: Neurosurgery

## 2018-02-02 LAB — BASIC METABOLIC PANEL
Anion gap: 12 (ref 5–15)
BUN: 5 mg/dL — AB (ref 6–20)
CALCIUM: 9.3 mg/dL (ref 8.9–10.3)
CHLORIDE: 101 mmol/L (ref 98–111)
CO2: 26 mmol/L (ref 22–32)
CREATININE: 0.92 mg/dL (ref 0.44–1.00)
GFR calc Af Amer: >60 mL/min (ref >60)
GFR calc non Af Amer: >60 mL/min (ref >60)
Glucose, Bld: 115 mg/dL — ABNORMAL HIGH (ref 70–99)
Potassium: 3.4 mmol/L — ABNORMAL LOW (ref 3.5–5.1)
Sodium: 139 mmol/L (ref 135–145)

## 2018-02-02 LAB — SODIUM
Sodium: 144 mmol/L (ref 135–145)
Sodium: 139 mmol/L (ref 135–145)
Sodium: 139 mmol/L (ref 135–145)

## 2018-02-02 MED ORDER — PNEUMOCOCCAL VAC POLYVALENT 25 MCG/0.5ML IJ INJ: 0.5000 mL | INJECTION | INTRAMUSCULAR | Status: DC

## 2018-02-02 MED ORDER — HYDROCODONE-ACETAMINOPHEN 5-325 MG PO TABS: 1.0000 | ORAL_TABLET | ORAL | 0 refills | Status: DC | PRN

## 2018-02-02 NOTE — Discharge Summary (Signed)
Discharge Summary  Date of Admission: 02/01/2018  Date of Discharge: 02/02/18  Attending Physician: Dr. Adele Dan Course: Patient was admitted following a combined ENT/neurosurgery endonasal approach for a pituitary mass. Due to intercavernous bleeding, the tumor resection was aborted and the patient was recovered in PACU. She was monitored overnight in the ICU. 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 She was discharged home and will follow up in clinic with Drs. Nundkumar and [employer].  Neurologic exam at discharge:  AOx3, PERRL, EOMI, FS, TM Strength 5/5 x4, SILTx4  [name], MD 02/02/18  10:24 AM

## 2018-02-02 NOTE — Progress Notes (Signed)
Neurosurgery Service Progress Note  Subjective: No acute events overnight. Had some bleeding from one of her nares, high UOP overnight but pt has baseline polyuria that she manages with polydipsia.    Objective: Vitals:   02/02/18 0400 02/02/18 0500 02/02/18 0600 02/02/18 0700  BP: 105/65 107/75 122/76 116/67  Pulse: 66 63 60 67  Resp: 14 14 14 15   Temp: 98.4 F (36.9 C)     TempSrc:      SpO2: 99% 100% 99% 98%  Weight:       Temp (24hrs), Avg:98.2 F (36.8 C), Min:97.5 F (36.4 C), Max:99.2 F (37.3 C)  CBC Latest Ref Rng & Units 01/24/2018  WBC 4.0 - 10.5 K/uL 7.4  Hemoglobin 12.0 - 15.0 g/dL 12.4  Hematocrit 36.0 - 46.0 % 39.4  Platelets 150 - 400 K/uL 289   BMP Latest Ref Rng & Units 02/01/2018 02/01/2018 01/24/2018  Glucose 70 - 99 mg/dL - - 102(H)  BUN 6 - 20 mg/dL - - 6  Creatinine 0.44 - 1.00 mg/dL - - 0.90  Sodium 135 - 145 mmol/L 144 148(H) 143  Potassium 3.5 - 5.1 mmol/L - - 4.0  Chloride 98 - 111 mmol/L - - 105  CO2 22 - 32 mmol/L - - 30  Calcium 8.9 - 10.3 mg/dL - - 9.9    Intake/Output Summary (Last 24 hours) at 02/02/2018 0819 Last data filed at 02/02/2018 0700 Gross per 24 hour  Intake 6693.72 ml  Output 9925 ml  Net -3231.28 ml    Current Facility-Administered Medications:  .  0.45 % sodium chloride infusion, , Intravenous, Continuous, Cindy Lamb, Cindy Faster, MD, Last Rate: 150 mL/hr at 02/02/18 0207 .  bisacodyl (DULCOLAX) EC tablet 5 mg, 5 mg, Oral, Daily PRN, Consuella Lose, MD .  bisacodyl (DULCOLAX) suppository 10 mg, 10 mg, Rectal, Daily PRN, Consuella Lose, MD .  cephALEXin (KEFLEX) capsule 500 mg, 500 mg, Oral, Q8H, Cindy Belfast, MD, 500 mg at 02/02/18 [phone] .  cholecalciferol (VITAMIN D) tablet 2,000 Units, 2,000 Units, Oral, Daily, Consuella Lose, MD .  desmopressin (DDAVP) tablet 0.2 mg, 0.2 mg, Oral, QHS, Consuella Lose, MD, 0.2 mg at 02/01/18 2106 .  famotidine (PEPCID) tablet 20 mg, 20 mg, Oral, Daily, Cindy, Nena Polio,  MD .  fluticasone (FLONASE) 50 MCG/ACT nasal spray 2 spray, 2 spray, Each Nare, Daily, Consuella Lose, MD .  gabapentin (NEURONTIN) capsule 300 mg, 300 mg, Oral, BID, Consuella Lose, MD, 300 mg at 02/01/18 2106 .  heparin injection 5,000 Units, 5,000 Units, Subcutaneous, Q8H, Cindy, Neelesh, MD .  HYDROcodone-acetaminophen (NORCO/VICODIN) 5-325 MG per tablet 1 tablet, 1 tablet, Oral, Q4H PRN, Consuella Lose, MD, 1 tablet at 02/01/18 2243 .  ketotifen (ZADITOR) 0.025 % ophthalmic solution 1 drop, 1 drop, Both Eyes, Daily PRN, Consuella Lose, MD .  labetalol (NORMODYNE,TRANDATE) injection 10-40 mg, 10-40 mg, Intravenous, Q10 min PRN, Consuella Lose, MD .  levothyroxine (SYNTHROID, LEVOTHROID) tablet 50 mcg, 50 mcg, Oral, QAC breakfast, Consuella Lose, MD .  loperamide (IMODIUM) capsule 2 mg, 2 mg, Oral, PRN, Consuella Lose, MD .  multivitamin with minerals tablet 1 tablet, 1 tablet, Oral, Daily, Consuella Lose, MD .  ondansetron (ZOFRAN) tablet 4 mg, 4 mg, Oral, Q4H PRN **OR** ondansetron (ZOFRAN) injection 4 mg, 4 mg, Intravenous, Q4H PRN, Consuella Lose, MD, 4 mg at 02/01/18 2111 .  ondansetron (ZOFRAN) tablet 4 mg, 4 mg, Oral, Q6H PRN, Consuella Lose, MD .  oxyCODONE-acetaminophen (PERCOCET/ROXICET) 5-325 MG per tablet 1 tablet, 1 tablet, Oral, Q4H PRN,  Consuella Lose, MD, 1 tablet at 02/02/18 [phone] .  [START ON 02/03/2018] pneumococcal 23 valent vaccine (PNU-IMMUNE) injection 0.5 mL, 0.5 mL, Intramuscular, Tomorrow-1000, Cindy, Neelesh, MD .  predniSONE (DELTASONE) tablet 20 mg, 20 mg, Oral, BID WC, Consuella Lose, MD, 20 mg at 02/01/18 1753 .  promethazine (PHENERGAN) tablet 12.5-25 mg, 12.5-25 mg, Oral, Q4H PRN, Consuella Lose, MD .  senna-docusate (Senokot-S) tablet 1 tablet, 1 tablet, Oral, QHS PRN, Consuella Lose, MD .  sodium chloride (OCEAN) 0.65 % nasal spray 4 spray, 4 spray, Each Nare, Q1H PRN, Cindy Belfast, MD .   valACYclovir (VALTREX) tablet 1,000 mg, 1,000 mg, Oral, Daily, Consuella Lose, MD   Physical Exam: AOx3, PERRL, EOMI, FS, TM, Strength 5/5 x4, SILTx4 Packs in place bilaterally with some bloody staining  Assessment & Plan: 42 y.o. female POD1 s/p endonasal approach and aborted pituitary adenoma resection, recovering well. -DI: d/c IVF, pt has intact thirst mechanism, continue home DDAVP, not tachy and not beta blocked -Packs per ENT -transfer to floor, d/c tele status -Pending nasal bleeding, patient can be discharged home if she feels better this afternoon, otherwise likely discharge tomorrow morning  [name]  01/24/18 5:08 PM

## 2018-02-04 MED FILL — Thrombin (Recombinant) For Soln 20000 Unit: CUTANEOUS | Qty: 1 | Status: AC

## 2018-02-07 ENCOUNTER — Other Ambulatory Visit: Payer: Self-pay | Admitting: Neurosurgery

## 2018-02-07 NOTE — Op Note (Signed)
NEUROSURGERY OPERATIVE NOTE   PREOP DIAGNOSIS:  1. Sellar/suprasellar mass   POSTOP DIAGNOSIS: Same  PROCEDURE: 1. Attempted stereotactic endoscopic transsphenoidal resection of pituitary tumor (aborted procedure)  SURGEON: Dr. Consuella Lose, MD  CO-SURGEON: Dr. Jerrell Belfast, MD  ANESTHESIA: General Endotracheal  EBL: 400cc  SPECIMENS: None  DRAINS: None  COMPLICATIONS: None immediate  CONDITION: Hemodynamically stable to PACU  HISTORY: Cindy Lamb is a 42 y.o. 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 Loanne Drilling from endocrinology.  She has been on preoperative hormone replacement including Synthroid, hydrocortisone, and DDAVP.  With significant mass-effect upon the optic chiasm, surgical resection was indicated.  The risks and benefits of the surgery were explained in detail to the patient and her family.  After all questions were answered informed consent was obtained and witnessed.  PROCEDURE IN DETAIL: The patient was brought to the operating room. After induction of general anesthesia, the patient was positioned on the operative table in the supine position. All pressure points were meticulously padded.  Preoperative stereotactic CT scan done in the ENT office was co-registered with surface markers until satisfactory accuracy was achieved.   After timeout was conducted, the approach to the sphenoid sinus and the face of the sella was conducted by Dr. Wilburn Cornelia using the endoscope.  Details are dictated in a separate operative report.    Once access to the sella was obtained, I began removal of the bone on the anterior face of the sella.  This was done with a combination of Kerrison punches.  In order to obtain easier access, Dr.  Wilburn Cornelia did further remove a portion of the posterior septum.  Once the dura over the face of the sella was exposed, I did identify the optical carotid recess bilaterally, including the presumed medial border of the carotid arteries bilaterally.  At this point, a small arachnoid knife was used to initially incise the dura over the face of the pituitary fossa close to the inferior margin of the sella.  Immediately upon opening the dura, there was brisk venous bleeding preventing visualization and further progress.  We therefore packed this face of the sella with morselized Gelfoam and cotton pledgets.  After about 2 minutes, bleeding was controlled, and irrigation was used to wash away any free Gelfoam.  I then attempted again to incise the dura slightly superior to the previous attempt.  Again, brisk venous bleeding was encountered preventing further progress.  In a similar fashion, bleeding was controlled with morselized Gelfoam and cotton pledgets.  A third attempt was then made even further superior with the same result with brisk venous bleeding which was controlled with morselized Gelfoam.  I did again confirm our location between the 2 carotid arteries both with stereotactic system and with visible anatomic landmarks.  Finally, a fourth attempt was made to open the dura overlying the pituitary with the same brisk venous bleeding.  We did attempt to place a cotton pledget and work through the bleeding however it was brisk enough that it prevented adequate visualization to proceed and remove tumor.  Bleeding was again controlled with morselized Gelfoam with thrombin and cotton pledgets.  At this point, having made 4 attempts to open the dura with brisk venous bleeding preventing further progress, I elected to abort the procedure at this point.  I do suspect  that this patient has a fairly robust intercavernous sinus which would explain the significant venous bleeding encountered after incising the dura over  the sella in multiple locations. I removed the cotton pledgets, and irrigated the sellar face with saline.  There was no active bleeding identified.  I then covered the dura with more morselized Gelfoam, and a larger piece of Gelfoam to the cover the entire exposed dural surface.  Closure of the endoscopic transsphenoidal approach was then affected by Dr. Wilburn Cornelia, details of which are dictated in a separate report.  At the end of the case all sponge, needle, and instrument counts were correct. The patient was then transferred to the stretcher, extubated, and taken to the post-anesthesia care unit in stable hemodynamic condition.

## 2018-02-24 ENCOUNTER — Other Ambulatory Visit: Payer: Self-pay | Admitting: Endocrinology

## 2018-02-24 NOTE — Telephone Encounter (Signed)
Please refill x 1 Ov is due  

## 2018-03-14 ENCOUNTER — Encounter (HOSPITAL_COMMUNITY)
Admission: RE | Admit: 2018-03-14 | Discharge: 2018-03-14 | Disposition: A | Discharge status: Home / Self Care | Payer: BLUE CROSS/BLUE SHIELD | Source: Ambulatory Visit | Attending: Neurosurgery | Admitting: Neurosurgery

## 2018-03-14 ENCOUNTER — Encounter (HOSPITAL_COMMUNITY): Payer: Self-pay

## 2018-03-14 ENCOUNTER — Other Ambulatory Visit: Payer: Self-pay

## 2018-03-14 DIAGNOSIS — Z01812 Encounter for preprocedural laboratory examination: Secondary | ICD-10-CM | POA: Insufficient documentation

## 2018-03-14 HISTORY — DX: Gastro-esophageal reflux disease without esophagitis: K21.9

## 2018-03-14 LAB — CBC
HCT: 36.4 % (ref 36.0–46.0)
HEMOGLOBIN: 11.7 g/dL — AB (ref 12.0–15.0)
MCH: 30.3 pg (ref 26.0–34.0)
MCHC: 32.1 g/dL (ref 30.0–36.0)
MCV: 94.3 fL (ref 78.0–100.0)
Platelets: 319 10*3/uL (ref 150–400)
RBC: 3.86 MIL/uL — ABNORMAL LOW (ref 3.87–5.11)
RDW: 13.1 % (ref 11.5–15.5)
WBC: 9.3 10*3/uL (ref 4.0–10.5)

## 2018-03-14 LAB — BASIC METABOLIC PANEL
ANION GAP: 8 (ref 5–15)
BUN: <5 mg/dL — ABNORMAL LOW (ref 6–20)
CHLORIDE: 106 mmol/L (ref 98–111)
CO2: 26 mmol/L (ref 22–32)
Calcium: 9.8 mg/dL (ref 8.9–10.3)
Creatinine, Ser: 0.85 mg/dL (ref 0.44–1.00)
GFR calc Af Amer: >60 mL/min (ref >60)
GFR calc non Af Amer: >60 mL/min (ref >60)
Glucose, Bld: 100 mg/dL — ABNORMAL HIGH (ref 70–99)
POTASSIUM: 3.8 mmol/L (ref 3.5–5.1)
Sodium: 140 mmol/L (ref 135–145)

## 2018-03-14 LAB — TYPE AND SCREEN
ABO/RH(D): AB POS
Antibody Screen: NEGATIVE

## 2018-03-14 NOTE — Pre-Procedure Instructions (Signed)
Cindy Lamb  03/14/2018      [id number] - [place], [place] [address] Phone: [phone] Fax: [phone]  [id number] - [place], [place] [address] Phone: [phone] Fax: [phone]    Your procedure is scheduled on 03-22-2018 Friday   Report to [facility] Admitting at 5:30A.M.   Call this number if you have problems the morning of surgery:  [phone]   Remember:  Do not eat food or drink liquids after midnight.  .                          Take these medicines the morning of surgery with A SIP OF WATER   Flonase nasal spray if needed Gabapentin(Neurontin) Levothyroxine(Synthroid) Ondansetron(Zofran) if needed Prednisone(Deltasone) Ranitidine(Zantac)    STOP TAKING ANY ASPIRIN (UNLESS OTHERWISE INSTRUCTED BY YOUR SURGEON),ANTIINFLAMATORIES (IBUPROFEN,ALEVE,MOTRIN,ADVIL,GOODY'S POWDERS),HERBAL SUPPLEMENTS,FISH OIL,AND VITAMINS 5-7 DAYS PRIOR TO SURGERY  Do not wear jewelry, make-up or nail polish.  Do not wear lotions, powders, or perfumes, or deodorant.  Do not shave 48 hours prior to surgery.  Men may shave face and neck.  Do not bring valuables to the hospital.  [facility] is not responsible for any belongings or valuables.  Contacts, dentures or bridgework may not be worn into surgery.  Leave your suitcase in the car.  After surgery it may be brought to your room.  For patients admitted to the hospital, discharge time will be determined by your treatment team.  Patients discharged the day of surgery will not be allowed to drive home.    [place] - Preparing for Surgery  Before surgery, you can play an important role.  Because skin is not sterile, your skin needs to be as free of germs as possible.  You can reduce the number of germs on you skin by washing with CHG (chlorahexidine gluconate) soap before surgery.  CHG  is an antiseptic cleaner which kills germs and bonds with the skin to continue killing germs even after washing.  Oral Hygiene is also important in reducing the risk of infection.  Remember to brush your teeth with your regular toothpaste the morning of surgery.  Please DO NOT use if you have an allergy to CHG or antibacterial soaps.  If your skin becomes reddened/irritated stop using the CHG and inform your nurse when you arrive at Short Stay.  Do not shave (including legs and underarms) for at least 48 hours prior to the first CHG shower.  You may shave your face.  Please follow these instructions carefully:   1.  Shower with CHG Soap the night before surgery and the morning of Surgery.  2.  If you choose to wash your hair, wash your hair first as usual with your normal shampoo.  3.  After you shampoo, rinse your hair and body thoroughly to remove the shampoo. 4.  Use CHG as you would any other liquid soap.  You can apply chg directly to the skin and wash gently with a      scrungie or washcloth.           5.  Apply the CHG Soap to your body ONLY FROM THE NECK DOWN.   Do not use on open wounds or open sores. Avoid contact with your eyes, ears, mouth and genitals (private parts).  Wash genitals (private parts) with your normal soap.  6.  Wash thoroughly, paying special attention to the area where your surgery will be performed.  7.  Thoroughly rinse your body with warm water from the neck down.  8.  DO NOT shower/wash with your normal soap after using and rinsing off the CHG Soap.  9.  Pat yourself dry with a clean towel.            10.  Wear clean pajamas.            11.  Place clean sheets on your bed the night of your first shower and do not sleep with pets.  Day of Surgery  Do not apply any lotions/deoderants the morning of surgery.   Please wear clean clothes to the hospital/surgery center. Remember to brush your teeth with toothpaste.    Please read over the following fact sheets  that you were given. Surgical Site Infection Prevention

## 2018-03-14 NOTE — Progress Notes (Signed)
PCP  Earlie Server Scifries PA-C  Pt. Has surgery in 01-2018 see note Willeen Cass   FNP-BC  01-25-2018 Epic

## 2018-03-20 ENCOUNTER — Other Ambulatory Visit: Payer: Self-pay | Admitting: Neurosurgery

## 2018-03-22 ENCOUNTER — Encounter (HOSPITAL_COMMUNITY): Payer: Self-pay | Admitting: *Deleted

## 2018-03-22 ENCOUNTER — Encounter (HOSPITAL_COMMUNITY)
Admission: RE | Disposition: A | Discharge status: Home Health | Payer: Self-pay | Source: Home / Self Care | Attending: Neurosurgery

## 2018-03-22 ENCOUNTER — Inpatient Hospital Stay (HOSPITAL_COMMUNITY): Payer: BLUE CROSS/BLUE SHIELD | Admitting: Registered Nurse

## 2018-03-22 ENCOUNTER — Inpatient Hospital Stay (HOSPITAL_COMMUNITY)
Admission: RE | Admit: 2018-03-22 | Discharge: 2018-03-31 | DRG: 026 | Disposition: A | Discharge status: Home Health | Payer: BLUE CROSS/BLUE SHIELD | Attending: Neurosurgery | Admitting: Neurosurgery

## 2018-03-22 DIAGNOSIS — E039 Hypothyroidism, unspecified: Secondary | ICD-10-CM | POA: Diagnosis present

## 2018-03-22 DIAGNOSIS — Z7952 Long term (current) use of systemic steroids: Secondary | ICD-10-CM

## 2018-03-22 DIAGNOSIS — Z8349 Family history of other endocrine, nutritional and metabolic diseases: Secondary | ICD-10-CM

## 2018-03-22 DIAGNOSIS — K219 Gastro-esophageal reflux disease without esophagitis: Secondary | ICD-10-CM | POA: Diagnosis present

## 2018-03-22 DIAGNOSIS — G9349 Other encephalopathy: Secondary | ICD-10-CM | POA: Diagnosis not present

## 2018-03-22 DIAGNOSIS — Z91040 Latex allergy status: Secondary | ICD-10-CM

## 2018-03-22 DIAGNOSIS — F17210 Nicotine dependence, cigarettes, uncomplicated: Secondary | ICD-10-CM | POA: Diagnosis present

## 2018-03-22 DIAGNOSIS — Z79890 Hormone replacement therapy (postmenopausal): Secondary | ICD-10-CM

## 2018-03-22 DIAGNOSIS — E232 Diabetes insipidus: Secondary | ICD-10-CM | POA: Diagnosis not present

## 2018-03-22 DIAGNOSIS — G930 Cerebral cysts: Secondary | ICD-10-CM | POA: Diagnosis present

## 2018-03-22 DIAGNOSIS — Z8249 Family history of ischemic heart disease and other diseases of the circulatory system: Secondary | ICD-10-CM | POA: Diagnosis not present

## 2018-03-22 DIAGNOSIS — R220 Localized swelling, mass and lump, head: Secondary | ICD-10-CM

## 2018-03-22 DIAGNOSIS — G9389 Other specified disorders of brain: Secondary | ICD-10-CM | POA: Diagnosis present

## 2018-03-22 DIAGNOSIS — Z9889 Other specified postprocedural states: Secondary | ICD-10-CM

## 2018-03-22 HISTORY — PX: APPLICATION OF CRANIAL NAVIGATION: SHX6578

## 2018-03-22 HISTORY — PX: CRANIOTOMY: SHX93

## 2018-03-22 LAB — POCT I-STAT 7, (LYTES, BLD GAS, ICA,H+H)
ACID-BASE DEFICIT: 2 mmol/L (ref 0.0–2.0)
Bicarbonate: 26.2 mmol/L (ref 20.0–28.0)
Bicarbonate: 22.9 mmol/L (ref 20.0–28.0)
CALCIUM ION: 1.21 mmol/L (ref 1.15–1.40)
Calcium, Ion: 1.23 mmol/L (ref 1.15–1.40)
HCT: 32 % — ABNORMAL LOW (ref 36.0–46.0)
HCT: 31 % — ABNORMAL LOW (ref 36.0–46.0)
HEMOGLOBIN: 10.9 g/dL — AB (ref 12.0–15.0)
HEMOGLOBIN: 10.5 g/dL — AB (ref 12.0–15.0)
O2 Saturation: 100 %
O2 Saturation: 100 %
PCO2 ART: 44.5 mmHg (ref 32.0–48.0)
PH ART: 7.393 (ref 7.350–7.450)
PO2 ART: 302 mmHg — AB (ref 83.0–108.0)
Potassium: 3.7 mmol/L (ref 3.5–5.1)
Potassium: 3.7 mmol/L (ref 3.5–5.1)
Sodium: 143 mmol/L (ref 135–145)
Sodium: 147 mmol/L — ABNORMAL HIGH (ref 135–145)
TCO2: 28 mmol/L (ref 22–32)
TCO2: 24 mmol/L (ref 22–32)
pCO2 arterial: 37.2 mmHg (ref 32.0–48.0)
pH, Arterial: 7.37 (ref 7.350–7.450)
pO2, Arterial: 229 mmHg — ABNORMAL HIGH (ref 83.0–108.0)

## 2018-03-22 SURGERY — CRANIOTOMY TUMOR EXCISION
Anesthesia: General | Laterality: Right

## 2018-03-22 MED ORDER — FENTANYL CITRATE (PF) 250 MCG/5ML IJ SOLN
INTRAMUSCULAR | Status: DC | PRN
  Administered 2018-03-22 (×3): 50 ug via INTRAVENOUS
  Administered 2018-03-22: 25 ug via INTRAVENOUS

## 2018-03-22 MED ORDER — BACITRACIN ZINC 500 UNIT/GM EX OINT
TOPICAL_OINTMENT | CUTANEOUS | Status: AC
  Filled 2018-03-22: qty 28.35

## 2018-03-22 MED ORDER — SODIUM CHLORIDE 0.9 % IV SOLN
INTRAVENOUS | Status: DC
  Administered 2018-03-22: 13:00:00 via INTRAVENOUS

## 2018-03-22 MED ORDER — THROMBIN 5000 UNITS EX SOLR
OROMUCOSAL | Status: DC | PRN
  Administered 2018-03-22: 08:00:00 via TOPICAL

## 2018-03-22 MED ORDER — SODIUM CHLORIDE 0.9 % IV SOLN
INTRAVENOUS | Status: DC | PRN
  Administered 2018-03-22: 07:00:00

## 2018-03-22 MED ORDER — EPHEDRINE 5 MG/ML INJ
INTRAVENOUS | Status: AC
  Filled 2018-03-22: qty 10

## 2018-03-22 MED ORDER — THROMBIN 5000 UNITS EX SOLR
CUTANEOUS | Status: AC
  Filled 2018-03-22: qty 10000

## 2018-03-22 MED ORDER — ROCURONIUM BROMIDE 10 MG/ML (PF) SYRINGE
PREFILLED_SYRINGE | INTRAVENOUS | Status: DC | PRN
  Administered 2018-03-22 (×2): 20 mg via INTRAVENOUS
  Administered 2018-03-22: 50 mg via INTRAVENOUS
  Administered 2018-03-22 (×2): 10 mg via INTRAVENOUS
  Administered 2018-03-22: 20 mg via INTRAVENOUS

## 2018-03-22 MED ORDER — ESMOLOL HCL 100 MG/10ML IV SOLN
INTRAVENOUS | Status: DC | PRN
  Administered 2018-03-22: 20 mg via INTRAVENOUS
  Administered 2018-03-22 (×2): 40 mg via INTRAVENOUS

## 2018-03-22 MED ORDER — 0.9 % SODIUM CHLORIDE (POUR BTL) OPTIME
TOPICAL | Status: DC | PRN
  Administered 2018-03-22 (×3): 1000 mL

## 2018-03-22 MED ORDER — HEMOSTATIC AGENTS (NO CHARGE) OPTIME
TOPICAL | Status: DC | PRN
  Administered 2018-03-22: 1 via TOPICAL

## 2018-03-22 MED ORDER — CHLORHEXIDINE GLUCONATE CLOTH 2 % EX PADS: 6.0000 | MEDICATED_PAD | Freq: Once | CUTANEOUS | Status: DC

## 2018-03-22 MED ORDER — MAGNESIUM CITRATE PO SOLN: 1.0000 | Freq: Once | ORAL | Status: DC | PRN

## 2018-03-22 MED ORDER — CEFAZOLIN SODIUM-DEXTROSE 2-4 GM/100ML-% IV SOLN
2.0000 g | Freq: Three times a day (TID) | INTRAVENOUS | Status: AC
  Administered 2018-03-22 (×2): 2 g via INTRAVENOUS
  Filled 2018-03-22 (×2): qty 100

## 2018-03-22 MED ORDER — DOCUSATE SODIUM 100 MG PO CAPS
100.0000 mg | ORAL_CAPSULE | Freq: Two times a day (BID) | ORAL | Status: DC
  Administered 2018-03-22 – 2018-03-31 (×6): 100 mg via ORAL
  Filled 2018-03-22 (×9): qty 1

## 2018-03-22 MED ORDER — ONDANSETRON HCL 4 MG/2ML IJ SOLN
4.0000 mg | INTRAMUSCULAR | Status: DC | PRN
  Administered 2018-03-22 – 2018-03-24 (×2): 4 mg via INTRAVENOUS
  Filled 2018-03-22 (×2): qty 2

## 2018-03-22 MED ORDER — PHENYLEPHRINE 40 MCG/ML (10ML) SYRINGE FOR IV PUSH (FOR BLOOD PRESSURE SUPPORT)
PREFILLED_SYRINGE | INTRAVENOUS | Status: DC | PRN
  Administered 2018-03-22 (×5): 80 ug via INTRAVENOUS

## 2018-03-22 MED ORDER — PHENYLEPHRINE 40 MCG/ML (10ML) SYRINGE FOR IV PUSH (FOR BLOOD PRESSURE SUPPORT)
PREFILLED_SYRINGE | INTRAVENOUS | Status: AC
  Filled 2018-03-22: qty 10

## 2018-03-22 MED ORDER — SODIUM CHLORIDE 0.9 % IV SOLN
INTRAVENOUS | Status: DC | PRN
  Administered 2018-03-22: 30 ug/min via INTRAVENOUS
  Administered 2018-03-22: 15 ug/min via INTRAVENOUS

## 2018-03-22 MED ORDER — PROPOFOL 10 MG/ML IV BOLUS
INTRAVENOUS | Status: AC
  Filled 2018-03-22: qty 20

## 2018-03-22 MED ORDER — ACETAMINOPHEN 325 MG PO TABS
650.0000 mg | ORAL_TABLET | ORAL | Status: DC | PRN
  Administered 2018-03-22: 650 mg via ORAL
  Filled 2018-03-22 (×3): qty 2

## 2018-03-22 MED ORDER — NALOXONE HCL 0.4 MG/ML IJ SOLN: 0.0800 mg | INTRAMUSCULAR | Status: DC | PRN

## 2018-03-22 MED ORDER — SODIUM CHLORIDE 0.9 % IR SOLN
Status: AC
  Administered 2018-03-22: 11:00:00
  Filled 2018-03-22: qty 500

## 2018-03-22 MED ORDER — DEXAMETHASONE SODIUM PHOSPHATE 10 MG/ML IJ SOLN
INTRAMUSCULAR | Status: DC | PRN
  Administered 2018-03-22: 10 mg via INTRAVENOUS

## 2018-03-22 MED ORDER — BACITRACIN ZINC 500 UNIT/GM EX OINT
TOPICAL_OINTMENT | CUTANEOUS | Status: DC | PRN
  Administered 2018-03-22: 1 via TOPICAL

## 2018-03-22 MED ORDER — DEXAMETHASONE SODIUM PHOSPHATE 10 MG/ML IJ SOLN
INTRAMUSCULAR | Status: AC
  Filled 2018-03-22: qty 1

## 2018-03-22 MED ORDER — SENNOSIDES-DOCUSATE SODIUM 8.6-50 MG PO TABS: 1.0000 | ORAL_TABLET | Freq: Every evening | ORAL | Status: DC | PRN

## 2018-03-22 MED ORDER — EPHEDRINE SULFATE-NACL 50-0.9 MG/10ML-% IV SOSY
PREFILLED_SYRINGE | INTRAVENOUS | Status: DC | PRN
  Administered 2018-03-22: 5 mg via INTRAVENOUS
  Administered 2018-03-22 (×2): 2.5 mg via INTRAVENOUS
  Administered 2018-03-22 (×2): 5 mg via INTRAVENOUS

## 2018-03-22 MED ORDER — LIDOCAINE-EPINEPHRINE 1 %-1:100000 IJ SOLN
INTRAMUSCULAR | Status: DC | PRN
  Administered 2018-03-22: 15 mL

## 2018-03-22 MED ORDER — LIDOCAINE-EPINEPHRINE 1 %-1:100000 IJ SOLN
INTRAMUSCULAR | Status: AC
  Filled 2018-03-22: qty 1

## 2018-03-22 MED ORDER — FENTANYL CITRATE (PF) 250 MCG/5ML IJ SOLN
INTRAMUSCULAR | Status: AC
  Filled 2018-03-22: qty 5

## 2018-03-22 MED ORDER — THROMBIN 20000 UNITS EX SOLR
CUTANEOUS | Status: DC | PRN
  Administered 2018-03-22: 07:00:00 via TOPICAL

## 2018-03-22 MED ORDER — HYDROCORTISONE 5 MG PO TABS
25.0000 mg | ORAL_TABLET | Freq: Every day | ORAL | Status: AC
  Administered 2018-03-22: 25 mg via ORAL
  Filled 2018-03-22 (×2): qty 1

## 2018-03-22 MED ORDER — CEFAZOLIN SODIUM-DEXTROSE 2-4 GM/100ML-% IV SOLN
2.0000 g | INTRAVENOUS | Status: AC
  Administered 2018-03-22: 2 g via INTRAVENOUS
  Filled 2018-03-22: qty 100

## 2018-03-22 MED ORDER — LEVETIRACETAM IN NACL 500 MG/100ML IV SOLN
500.0000 mg | Freq: Two times a day (BID) | INTRAVENOUS | Status: DC
  Administered 2018-03-22 – 2018-03-30 (×16): 500 mg via INTRAVENOUS
  Filled 2018-03-22 (×16): qty 100

## 2018-03-22 MED ORDER — PROMETHAZINE HCL 25 MG PO TABS: 12.5000 mg | ORAL_TABLET | ORAL | Status: DC | PRN

## 2018-03-22 MED ORDER — ROCURONIUM BROMIDE 50 MG/5ML IV SOSY
PREFILLED_SYRINGE | INTRAVENOUS | Status: AC
  Filled 2018-03-22: qty 5

## 2018-03-22 MED ORDER — SUCCINYLCHOLINE CHLORIDE 200 MG/10ML IV SOSY
PREFILLED_SYRINGE | INTRAVENOUS | Status: DC | PRN
  Administered 2018-03-22: 140 mg via INTRAVENOUS

## 2018-03-22 MED ORDER — THROMBIN 20000 UNITS EX KIT
PACK | CUTANEOUS | Status: AC
  Filled 2018-03-22: qty 1

## 2018-03-22 MED ORDER — SODIUM CHLORIDE 0.9 % IV SOLN
INTRAVENOUS | Status: DC | PRN
  Administered 2018-03-22 (×3): via INTRAVENOUS

## 2018-03-22 MED ORDER — ESMOLOL HCL 100 MG/10ML IV SOLN
INTRAVENOUS | Status: AC
  Filled 2018-03-22: qty 10

## 2018-03-22 MED ORDER — SUGAMMADEX SODIUM 200 MG/2ML IV SOLN
INTRAVENOUS | Status: DC | PRN
  Administered 2018-03-22: 200 mg via INTRAVENOUS

## 2018-03-22 MED ORDER — ACETAMINOPHEN 650 MG RE SUPP
650.0000 mg | RECTAL | Status: DC | PRN
  Administered 2018-03-25 – 2018-03-27 (×6): 650 mg via RECTAL
  Filled 2018-03-22 (×7): qty 1

## 2018-03-22 MED ORDER — LEVETIRACETAM IN NACL 1000 MG/100ML IV SOLN
1000.0000 mg | INTRAVENOUS | Status: AC
  Administered 2018-03-22: 1000 mg via INTRAVENOUS
  Filled 2018-03-22: qty 100

## 2018-03-22 MED ORDER — PROPOFOL 10 MG/ML IV BOLUS
INTRAVENOUS | Status: DC | PRN
  Administered 2018-03-22: 120 mg via INTRAVENOUS
  Administered 2018-03-22: 100 mg via INTRAVENOUS
  Administered 2018-03-22 (×2): 50 mg via INTRAVENOUS

## 2018-03-22 MED ORDER — BUPIVACAINE HCL (PF) 0.5 % IJ SOLN
INTRAMUSCULAR | Status: DC | PRN
  Administered 2018-03-22: 15 mL

## 2018-03-22 MED ORDER — LABETALOL HCL 5 MG/ML IV SOLN: 10.0000 mg | INTRAVENOUS | Status: DC | PRN

## 2018-03-22 MED ORDER — HYDROCODONE-ACETAMINOPHEN 5-325 MG PO TABS
1.0000 | ORAL_TABLET | ORAL | Status: DC | PRN
  Administered 2018-03-23 – 2018-03-30 (×4): 1 via ORAL
  Filled 2018-03-22 (×4): qty 1

## 2018-03-22 MED ORDER — ONDANSETRON HCL 4 MG/2ML IJ SOLN
INTRAMUSCULAR | Status: AC
  Filled 2018-03-22: qty 2

## 2018-03-22 MED ORDER — SODIUM CHLORIDE 0.9 % IV SOLN
INTRAVENOUS | Status: DC | PRN
  Administered 2018-03-22 (×2): via INTRAVENOUS

## 2018-03-22 MED ORDER — ARTIFICIAL TEARS OPHTHALMIC OINT
TOPICAL_OINTMENT | OPHTHALMIC | Status: DC | PRN
  Administered 2018-03-22: 1 via OPHTHALMIC

## 2018-03-22 MED ORDER — BISACODYL 5 MG PO TBEC: 5.0000 mg | DELAYED_RELEASE_TABLET | Freq: Every day | ORAL | Status: DC | PRN

## 2018-03-22 MED ORDER — LIDOCAINE 2% (20 MG/ML) 5 ML SYRINGE
INTRAMUSCULAR | Status: AC
  Filled 2018-03-22: qty 5

## 2018-03-22 MED ORDER — HYDROMORPHONE HCL 1 MG/ML IJ SOLN
0.5000 mg | INTRAMUSCULAR | Status: DC | PRN
  Administered 2018-03-22 – 2018-03-24 (×14): 1 mg via INTRAVENOUS
  Filled 2018-03-22 (×14): qty 1

## 2018-03-22 MED ORDER — SODIUM CHLORIDE 0.9 % IV SOLN
0.0500 ug/kg/min | INTRAVENOUS | Status: AC
  Administered 2018-03-22: .2 ug/kg/min via INTRAVENOUS
  Filled 2018-03-22: qty 4000

## 2018-03-22 MED ORDER — ONDANSETRON HCL 4 MG/2ML IJ SOLN
INTRAMUSCULAR | Status: DC | PRN
  Administered 2018-03-22 (×2): 4 mg via INTRAVENOUS

## 2018-03-22 MED ORDER — BUPIVACAINE HCL (PF) 0.5 % IJ SOLN
INTRAMUSCULAR | Status: AC
  Filled 2018-03-22: qty 30

## 2018-03-22 MED ORDER — LIDOCAINE 2% (20 MG/ML) 5 ML SYRINGE
INTRAMUSCULAR | Status: DC | PRN
  Administered 2018-03-22: 60 mg via INTRAVENOUS
  Administered 2018-03-22: 40 mg via INTRAVENOUS

## 2018-03-22 MED ORDER — ONDANSETRON HCL 4 MG PO TABS: 4.0000 mg | ORAL_TABLET | ORAL | Status: DC | PRN

## 2018-03-22 SURGICAL SUPPLY — 111 items
BATTERY IQ STERILE (MISCELLANEOUS) ×2 IMPLANT
BENZOIN TINCTURE PRP APPL 2/3 (GAUZE/BANDAGES/DRESSINGS) IMPLANT
BLADE CLIPPER SURG (BLADE) ×2 IMPLANT
BLADE SAW GIGLI 16 STRL (MISCELLANEOUS) IMPLANT
BLADE SURG 15 STRL LF DISP TIS (BLADE) IMPLANT
BLADE SURG 15 STRL SS (BLADE)
BLADE ULTRA TIP 2M (BLADE) ×2 IMPLANT
BNDG GAUZE ELAST 4 BULKY (GAUZE/BANDAGES/DRESSINGS) ×2 IMPLANT
BNDG GAUZE STRTCH 6 (GAUZE/BANDAGES/DRESSINGS) ×2 IMPLANT
BNDG STRETCH 4X75 STRL LF (GAUZE/BANDAGES/DRESSINGS) IMPLANT
BUR ACORN 6.0 PRECISION (BURR) ×2 IMPLANT
BUR ACORN 9.0 PRECISION (BURR) IMPLANT
BUR ROUND FLUTED 4 SOFT TCH (BURR) IMPLANT
BUR SPIRAL ROUTER 2.3 (BUR) ×2 IMPLANT
CANISTER SUCT 3000ML PPV (MISCELLANEOUS) ×4 IMPLANT
CARTRIDGE OIL MAESTRO DRILL (MISCELLANEOUS) ×2 IMPLANT
CATH VENTRIC 35X38 W/TROCAR LG (CATHETERS) IMPLANT
CLIP VESOCCLUDE MED 6/CT (CLIP) IMPLANT
CONT SPEC 4OZ CLIKSEAL STRL BL (MISCELLANEOUS) ×2 IMPLANT
COVER MAYO STAND STRL (DRAPES) IMPLANT
COVER WAND RF STERILE (DRAPES) IMPLANT
DECANTER SPIKE VIAL GLASS SM (MISCELLANEOUS) ×2 IMPLANT
DIFFUSER DRILL AIR PNEUMATIC (MISCELLANEOUS) ×4 IMPLANT
DRAIN SUBARACHNOID (WOUND CARE) IMPLANT
DRAPE HALF SHEET 40X57 (DRAPES) ×2 IMPLANT
DRAPE MICROSCOPE LEICA (MISCELLANEOUS) ×2 IMPLANT
DRAPE NEUROLOGICAL W/INCISE (DRAPES) ×2 IMPLANT
DRAPE STERI IOBAN 125X83 (DRAPES) IMPLANT
DRAPE SURG 17X23 STRL (DRAPES) IMPLANT
DRAPE WARM FLUID 44X44 (DRAPE) ×2 IMPLANT
DRSG ADAPTIC 3X8 NADH LF (GAUZE/BANDAGES/DRESSINGS) ×2 IMPLANT
DRSG TELFA 3X8 NADH (GAUZE/BANDAGES/DRESSINGS) ×2 IMPLANT
DURAMATRIX ONLAY 3X3 (Plate) ×2 IMPLANT
DURAPREP 6ML APPLICATOR 50/CS (WOUND CARE) ×2 IMPLANT
ELECT REM PT RETURN 9FT ADLT (ELECTROSURGICAL) ×2
ELECTRODE REM PT RTRN 9FT ADLT (ELECTROSURGICAL) ×1 IMPLANT
EVACUATOR 1/8 PVC DRAIN (DRAIN) IMPLANT
EVACUATOR SILICONE 100CC (DRAIN) IMPLANT
FORCEPS BIPOLAR SPETZLER 8 1.0 (NEUROSURGERY SUPPLIES) ×2 IMPLANT
GAUZE 4X4 16PLY RFD (DISPOSABLE) IMPLANT
GAUZE SPONGE 4X4 12PLY STRL (GAUZE/BANDAGES/DRESSINGS) ×2 IMPLANT
GLOVE BIO SURGEON STRL SZ7 (GLOVE) IMPLANT
GLOVE BIOGEL PI IND STRL 6.5 (GLOVE) ×3 IMPLANT
GLOVE BIOGEL PI IND STRL 7.0 (GLOVE) ×1 IMPLANT
GLOVE BIOGEL PI IND STRL 7.5 (GLOVE) ×7 IMPLANT
GLOVE BIOGEL PI INDICATOR 6.5 (GLOVE) ×3
GLOVE BIOGEL PI INDICATOR 7.0 (GLOVE) ×1
GLOVE BIOGEL PI INDICATOR 7.5 (GLOVE) ×7
GLOVE ECLIPSE 7.0 STRL STRAW (GLOVE) IMPLANT
GLOVE EXAM NITRILE LRG STRL (GLOVE) IMPLANT
GLOVE EXAM NITRILE XL STR (GLOVE) IMPLANT
GLOVE EXAM NITRILE XS STR PU (GLOVE) IMPLANT
GLOVE SURG SS PI 6.0 STRL IVOR (GLOVE) ×4 IMPLANT
GLOVE SURG SS PI 6.5 STRL IVOR (GLOVE) ×8 IMPLANT
GLOVE SURG SS PI 7.0 STRL IVOR (GLOVE) ×2 IMPLANT
GLOVE SURG SS PI 7.5 STRL IVOR (GLOVE) ×6 IMPLANT
GOWN STRL REUS W/ TWL LRG LVL3 (GOWN DISPOSABLE) ×6 IMPLANT
GOWN STRL REUS W/ TWL XL LVL3 (GOWN DISPOSABLE) ×2 IMPLANT
GOWN STRL REUS W/TWL 2XL LVL3 (GOWN DISPOSABLE) IMPLANT
GOWN STRL REUS W/TWL LRG LVL3 (GOWN DISPOSABLE) ×6
GOWN STRL REUS W/TWL XL LVL3 (GOWN DISPOSABLE) ×2
HEMOSTAT POWDER KIT SURGIFOAM (HEMOSTASIS) ×2 IMPLANT
HEMOSTAT SURGICEL 2X14 (HEMOSTASIS) IMPLANT
HOOK DURA 1/2IN (MISCELLANEOUS) ×2 IMPLANT
IV NS 1000ML (IV SOLUTION)
IV NS 1000ML BAXH (IV SOLUTION) IMPLANT
KIT BASIN OR (CUSTOM PROCEDURE TRAY) ×2 IMPLANT
KIT DRAIN CSF ACCUDRAIN (MISCELLANEOUS) IMPLANT
KIT TURNOVER KIT B (KITS) ×2 IMPLANT
KNIFE ARACHNOID DISP AM-24-S (MISCELLANEOUS) ×2 IMPLANT
MARKER SPHERE PSV REFLC 13MM (MARKER) ×6 IMPLANT
NEEDLE HYPO 22GX1.5 SAFETY (NEEDLE) ×2 IMPLANT
NEEDLE SPNL 18GX3.5 QUINCKE PK (NEEDLE) IMPLANT
NS IRRIG 1000ML POUR BTL (IV SOLUTION) ×6 IMPLANT
OIL CARTRIDGE MAESTRO DRILL (MISCELLANEOUS) ×4
PACK CRANIOTOMY CUSTOM (CUSTOM PROCEDURE TRAY) ×2 IMPLANT
PATTIES SURGICAL .25X.25 (GAUZE/BANDAGES/DRESSINGS) IMPLANT
PATTIES SURGICAL .5 X.5 (GAUZE/BANDAGES/DRESSINGS) ×2 IMPLANT
PATTIES SURGICAL .5 X3 (DISPOSABLE) IMPLANT
PATTIES SURGICAL 1/4 X 3 (GAUZE/BANDAGES/DRESSINGS) IMPLANT
PATTIES SURGICAL 1X1 (DISPOSABLE) IMPLANT
PIN MAYFIELD SKULL DISP (PIN) ×2 IMPLANT
PLATE 1.5  2HOLE LNG NEURO (Plate) ×2 IMPLANT
PLATE 1.5 2HOLE LNG NEURO (Plate) ×2 IMPLANT
PLATE 1.5 4HOLE LONG STRAIGHT (Plate) ×2 IMPLANT
RUBBERBAND STERILE (MISCELLANEOUS) ×4 IMPLANT
SCREW SELF DRILL HT 1.5/4MM (Screw) ×12 IMPLANT
SET TUBING W/EXT DISP (INSTRUMENTS) IMPLANT
SPECIMEN JAR SMALL (MISCELLANEOUS) IMPLANT
SPONGE NEURO XRAY DETECT 1X3 (DISPOSABLE) IMPLANT
SPONGE SURGIFOAM ABS GEL 100 (HEMOSTASIS) ×2 IMPLANT
STAPLER VISISTAT 35W (STAPLE) ×2 IMPLANT
STOCKINETTE 6  STRL (DRAPES)
STOCKINETTE 6 STRL (DRAPES) IMPLANT
SUT ETHILON 3 0 FSL (SUTURE) IMPLANT
SUT ETHILON 3 0 PS 1 (SUTURE) IMPLANT
SUT NURALON 4 0 TR CR/8 (SUTURE) ×4 IMPLANT
SUT SILK 0 TIES 10X30 (SUTURE) IMPLANT
SUT VIC AB 0 CT1 18XCR BRD8 (SUTURE) ×3 IMPLANT
SUT VIC AB 0 CT1 8-18 (SUTURE) ×3
SUT VIC AB 3-0 SH 8-18 (SUTURE) ×6 IMPLANT
TAPE CLOTH 1X10 TAN NS (GAUZE/BANDAGES/DRESSINGS) IMPLANT
TIP STRAIGHT 25KHZ (INSTRUMENTS) IMPLANT
TOWEL GREEN STERILE (TOWEL DISPOSABLE) ×2 IMPLANT
TOWEL GREEN STERILE FF (TOWEL DISPOSABLE) ×2 IMPLANT
TRAY FOL W/BAG SLVR 16FR STRL (SET/KITS/TRAYS/PACK) ×1 IMPLANT
TRAY FOLEY MTR SLVR 16FR STAT (SET/KITS/TRAYS/PACK) IMPLANT
TRAY FOLEY W/BAG SLVR 16FR LF (SET/KITS/TRAYS/PACK) ×1
TUBE CONNECTING 12X1/4 (SUCTIONS) ×2 IMPLANT
UNDERPAD 30X30 (UNDERPADS AND DIAPERS) ×2 IMPLANT
WATER STERILE IRR 1000ML POUR (IV SOLUTION) ×2 IMPLANT

## 2018-03-22 NOTE — H&P (Signed)
Chief Complaint   Brain tumor  HPI   HPI: Cindy Lamb is a 42 y.o. female known cystic sellar/suprasellar mass mass that was discovered a little over a year ago when she began having headaches in combination with endocrine dysfunction who which she has been seeing Dr Loanne Drilling. Unfortunately, over the last few months, her headaches have increased in intensity and frequency. Because of the worsening headaches, she has decided to pursue surgical resection. She has been seen and evaluated by ophthalmology and visual field testing was normal.   In August 2019, she underwent transphenoidal approach for resection but due to significant venous bleeding, transphenoidal approach was cancelled. It was determine the best option would be resection via craniotomy. She presents today for surgery.    Patient Active Problem List   Diagnosis Date Noted  . Deviated septum 02/01/2018  . 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 Cervicogenic headache 12/13/2015    PMH: Past Medical History:  Diagnosis Date  . Anemia   . Anxiety   . 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 Dyspnea    not normally  . Dysrhythmia    palpitations occasionally with chest pain (stabbing pain)   PAC's  . GERD (gastroesophageal reflux disease)   . High cholesterol   . Hypothyroidism   . IBS (irritable bowel syndrome)   . Low back pain    2 bulging discs  . Migraines   . Spinal stenosis    Mild    PSH: Past Surgical History:  Procedure Laterality Date  . ABDOMINAL HYSTERECTOMY    . CRANIOTOMY N/A 02/01/2018   Procedure: CRANIOTOMY TRANSPHENOIDAL RESECTION OF PITUITARY TUMOR;  Surgeon: Consuella Lose, MD;  Location: [facility];  Service: Neurosurgery;  Laterality: N/A;  CRANIOTOMY TRANSPHENOIDAL RESECTION OF PITUITARY TUMOR  . CYSTECTOMY  2006  . DILATION AND CURETTAGE OF UTERUS    . NASAL SEPTOPLASTY W/ TURBINOPLASTY N/A 02/01/2018    Procedure: POSSIBLE  NASAL SEPTOPLASTY WITH TURBINATE REDUCTION CONSIDER ENDOSCOPIC  EXCISION OF LEFT CONCHA BULLOSA AND REMOVAL OF LEFT MAXILLARY SINUS MUCOUS RETENTION CYST.;  Surgeon: Jerrell Belfast, MD;  Location: [facility];  Service: ENT;  Laterality: N/A;  . ORIF ANKLE FRACTURE BIMALLEOLAR  2010  . PARTIAL HYSTERECTOMY  2006  . TRANSPHENOIDAL APPROACH EXPOSURE N/A 02/01/2018   Procedure: TRANSPHENOIDAL APPROACH EXPOSURE WITH FUSION PROTOCOL;  Surgeon: Jerrell Belfast, MD;  Location: [facility];  Service: ENT;  Laterality: N/A;  . wisdom teeth removal      Medications Prior to Admission  Medication Sig Dispense Refill Last Dose  . cetirizine (ZYRTEC) 10 MG tablet Take 10 mg by mouth at bedtime.     . Cholecalciferol (VITAMIN D-3) 5000 units TABS Take 5,000 Units by mouth daily.     Marland Kitchen desmopressin (DDAVP) 0.1 MG tablet Take 1 tablet (0.1 mg total) by mouth at bedtime. 30 tablet 11 01/31/2018 at Unknown time  . diphenhydramine-acetaminophen (TYLENOL PM) 25-500 MG TABS tablet Take 2 tablets by mouth at bedtime as needed (for sleep/pain).     . fluticasone (FLONASE) 50 MCG/ACT nasal spray Place 2 sprays into both nostrils 2 (two) times daily.    02/01/2018 at Unknown time  . gabapentin (NEURONTIN) 100 MG capsule Take 1 capsule (100 mg total) by mouth 3 (three) times daily. (Patient taking differently: Take 300 mg by mouth 3 (three) times daily. ) 60 capsule 5 01/31/2018 at Unknown time  . levothyroxine (SYNTHROID, LEVOTHROID) 50 MCG  tablet TAKE 1 TABLET BY MOUTH DAILY BEFORE BREAKFAST. (Patient taking differently: Take 50 mcg by mouth daily before breakfast. ) 90 tablet 0   . Multiple Vitamin (MULTIVITAMIN) tablet Take 1 tablet by mouth daily.   Past Month at Unknown time  . Naphazoline HCl (CLEAR EYES OP) Place 1 drop into both eyes daily as needed (dry/red eyes).   Past Week at Unknown time  . ondansetron (ZOFRAN) 4 MG tablet Take 1 tablet (4 mg total) by mouth every 8 (eight) hours as needed for  nausea or vomiting. (Patient taking differently: Take 4 mg by mouth every 6 (six) hours as needed for nausea or vomiting. ) 20 tablet 0 Past Week at Unknown time  . predniSONE (DELTASONE) 1 MG tablet Take 1 mg by mouth daily with breakfast.   02/01/2018 at Unknown time  . ranitidine (ZANTAC) 150 MG tablet Take 150 mg by mouth 2 (two) times daily.     . sodium chloride (OCEAN) 0.65 % SOLN nasal spray Place 1 spray into both nostrils every 4 (four) hours.     . cephALEXin (KEFLEX) 500 MG capsule Take 1 capsule (500 mg total) by mouth 3 (three) times daily. (Patient not taking: Reported on 03/08/2018) 30 capsule 1 Not Taking at Unknown time  . HYDROcodone-acetaminophen (NORCO/VICODIN) 5-325 MG tablet Take 1 tablet by mouth every 4 (four) hours as needed for moderate pain. (Patient not taking: Reported on 03/08/2018) 30 tablet 0 Not Taking at Unknown time  . valACYclovir (VALTREX) 1000 MG tablet Take 1,000 mg by mouth daily.   02/01/2018 at Unknown time    SH: Social History   Tobacco Use  . Smoking status: Current Every Day Smoker    Packs/day: 0.25    Years: 18.00    Pack years: 4.50    Types: Cigarettes  . Smokeless tobacco: Never Used  Substance Use Topics  . Alcohol use: No    Alcohol/week: 0.0 standard drinks    Comment: 0-5 drinks per wk  . Drug use: No    Comment: occ marijuana    MEDS: Prior to Admission medications   Medication Sig Start Date End Date Taking? Authorizing Provider  cetirizine (ZYRTEC) 10 MG tablet Take 10 mg by mouth at bedtime.   Yes [provider]  Cholecalciferol (VITAMIN D-3) 5000 units TABS Take 5,000 Units by mouth daily.   Yes [provider]  desmopressin (DDAVP) 0.1 MG tablet Take 1 tablet (0.1 mg total) by mouth at bedtime. 07/03/17  Yes Renato Shin, MD  diphenhydramine-acetaminophen (TYLENOL PM) 25-500 MG TABS tablet Take 2 tablets by mouth at bedtime as needed (for sleep/pain).   Yes [provider]  fluticasone (FLONASE) 50  MCG/ACT nasal spray Place 2 sprays into both nostrils 2 (two) times daily.    Yes [provider]  gabapentin (NEURONTIN) 100 MG capsule Take 1 capsule (100 mg total) by mouth 3 (three) times daily. Patient taking differently: Take 300 mg by mouth 3 (three) times daily.  11/16/16  Yes Ward Givens, NP  levothyroxine (SYNTHROID, LEVOTHROID) 50 MCG tablet TAKE 1 TABLET BY MOUTH DAILY BEFORE BREAKFAST. Patient taking differently: Take 50 mcg by mouth daily before breakfast.  02/25/18  Yes Renato Shin, MD  Multiple Vitamin (MULTIVITAMIN) tablet Take 1 tablet by mouth daily.   Yes [provider]  Naphazoline HCl (CLEAR EYES OP) Place 1 drop into both eyes daily as needed (dry/red eyes).   Yes [provider]  ondansetron (ZOFRAN) 4 MG tablet Take 1 tablet (  4 mg total) by mouth every 8 (eight) hours as needed for nausea or vomiting. Patient taking differently: Take 4 mg by mouth every 6 (six) hours as needed for nausea or vomiting.  11/16/16  Yes Ward Givens, NP  predniSONE (DELTASONE) 1 MG tablet Take 1 mg by mouth daily with breakfast.   Yes [provider]  ranitidine (ZANTAC) 150 MG tablet Take 150 mg by mouth 2 (two) times daily.   Yes [provider]  sodium chloride (OCEAN) 0.65 % SOLN nasal spray Place 1 spray into both nostrils every 4 (four) hours.   Yes [provider]  cephALEXin (KEFLEX) 500 MG capsule Take 1 capsule (500 mg total) by mouth 3 (three) times daily. Patient not taking: Reported on 03/08/2018 02/01/18   Jerrell Belfast, MD  HYDROcodone-acetaminophen (NORCO/VICODIN) 5-325 MG tablet Take 1 tablet by mouth every 4 (four) hours as needed for moderate pain. Patient not taking: Reported on 03/08/2018 02/02/18   [name], MD  valACYclovir (VALTREX) 1000 MG tablet Take 1,000 mg by mouth daily.    [provider]    ALLERGY: Allergies  Allergen Reactions  . Latex Rash    Social History   Tobacco Use  .  Smoking status: Current Every Day Smoker    Packs/day: 0.25    Years: 18.00    Pack years: 4.50    Types: Cigarettes  . Smokeless tobacco: Never Used  Substance Use Topics  . Alcohol use: No    Alcohol/week: 0.0 standard drinks    Comment: 0-5 drinks per wk     Family History  Problem Relation Age of Onset  . Prostate cancer Maternal Grandfather   . Hypertension Maternal Grandfather   . High Cholesterol Maternal Grandfather   . Hypertension Maternal Grandmother   . High Cholesterol Maternal Grandmother   . Other Neg Hx        pituitary disorder     ROS   ROS  Exam   There were no vitals filed for this visit. General appearance: WDWN, NAD Eyes: PERRL, Fundoscopic: normal Cardiovascular: Regular rate and rhythm without murmurs, rubs, gallops. No edema or variciosities. Distal pulses normal. Pulmonary: Clear to auscultation Musculoskeletal:     Muscle tone upper extremities: Normal    Muscle tone lower extremities: Normal    Motor exam: Upper Extremities Deltoid Bicep Tricep Grip  Right 5/5 5/5 5/5 5/5  Left 5/5 5/5 5/5 5/5   Lower Extremity IP Quad PF DF EHL  Right 5/5 5/5 5/5 5/5 5/5  Left 5/5 5/5 5/5 5/5 5/5   Neurological Awake, alert, oriented Memory and concentration grossly intact Speech fluent, appropriate CNII: Visual fields normal CNIII/IV/VI: EOMI CNV: Facial sensation normal CNVII: Symmetric, normal strength CNVIII: Grossly normal CNIX: Normal palate movement CNXI: Trap and SCM strength normal CN XII: Tongue protrusion normal Sensation grossly intact to LT DTR: Normal Coordination (finger/nose & heel/shin): Normal  Results - Imaging/Labs   No results found for this or any previous visit (from the past 48 hour(s)).  No results found.  IMAGING: MRI of the brain dated 07/07/2017 was again reviewed.  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  There is small superiorly  and anteriorly located lobulated enhancement.  Differential diagnosis includes craniopharyngioma or possibly Rathke's cleft cyst, with cystic pituitary adenoma may be less likely.  Impression/Plan   42 y.o. female with cystic sellar/suprasellar mass and some endocrinopathy although normal vision.  There is radiographic compression of  the optic apparatus. We will plan on proceeding with stereotactic right pterional craniotomy for resection .  While in the office, risks, benefits and alternatives of the surgery were discussed.  Patient states in own language understanding of the procedure and wishes to proceed.  Consent has been signed.

## 2018-03-22 NOTE — Progress Notes (Signed)
Pt urine clear, 2L+ since arriving on unit. Specific gravity of 1.0019. MD paged, stated he would be up to assess pt after current OR case. No orders received. Will continue to monitor.

## 2018-03-22 NOTE — Anesthesia Procedure Notes (Signed)
Arterial Line Insertion Start/End10/09/2017 7:00 AM, 03/22/2018 7:15 AM Performed by: Jearld Pies, CRNA, CRNA  Patient location: Pre-op. Preanesthetic checklist: patient identified, IV checked, site marked, risks and benefits discussed, surgical consent, monitors and equipment checked, pre-op evaluation, timeout performed and anesthesia consent Lidocaine 1% used for infiltration Left, radial was placed Catheter size: 20 G Hand hygiene performed , maximum sterile barriers used  and Seldinger technique used Allen's test indicative of satisfactory collateral circulation Attempts: 2 Procedure performed without using ultrasound guided technique. Following insertion, dressing applied and Biopatch. Post procedure assessment: normal  Patient tolerated the procedure well with no immediate complications.

## 2018-03-22 NOTE — Progress Notes (Signed)
Neuro surgery paged about patient's high urine output and mental status. Told to encourage PO liquids in patient and to do hourly specific gravity checks. Will continue to monitor.

## 2018-03-22 NOTE — Anesthesia Preprocedure Evaluation (Signed)
Anesthesia Evaluation  Patient identified by MRN, date of birth, ID band Patient awake    Reviewed: Allergy & Precautions, NPO status , Patient's Chart, lab work & pertinent test results  Airway Mallampati: II  TM Distance: >3 FB Neck ROM: Full    Dental  (+) Teeth Intact, Dental Advisory Given   Pulmonary Current Smoker,    breath sounds clear to auscultation       Cardiovascular  Rhythm:Regular Rate:Normal     Neuro/Psych    GI/Hepatic   Endo/Other    Renal/GU      Musculoskeletal   Abdominal   Peds  Hematology   Anesthesia Other Findings   Reproductive/Obstetrics                             Anesthesia Physical Anesthesia Plan  ASA: III  Anesthesia Plan: General   Post-op Pain Management:    Induction: Intravenous  PONV Risk Score and Plan: 1 and Ondansetron and Dexamethasone  Airway Management Planned: Oral ETT  Additional Equipment: Arterial line  Intra-op Plan:   Post-operative Plan: Extubation in OR  Informed Consent: I have reviewed the patients History and Physical, chart, labs and discussed the procedure including the risks, benefits and alternatives for the proposed anesthesia with the patient or authorized representative who has indicated his/her understanding and acceptance.   Dental advisory given  Plan Discussed with: CRNA and Anesthesiologist  Anesthesia Plan Comments:         Anesthesia Quick Evaluation

## 2018-03-22 NOTE — Progress Notes (Signed)
Neuro surgery called and said q 4 hour specific gravity checks are alright. Will continue to monitor.

## 2018-03-22 NOTE — Transfer of Care (Signed)
Immediate Anesthesia Transfer of Care Note  Patient: [facility]  Procedure(s) Performed: STEREOTACTIC RIGHT PTERIONAL CRANIOTOMY, RESECTION OF CRANIOPHARYNGIOMA (Right ) APPLICATION OF CRANIAL NAVIGATION (Right )  Patient Location: PACU  Anesthesia Type:General  Level of Consciousness: drowsy, patient cooperative and responds to stimulation  Airway & Oxygen Therapy: Patient Spontanous Breathing and Patient connected to nasal cannula oxygen  Post-op Assessment: Report given to RN, Post -op Vital signs reviewed and stable and Patient moving all extremities  Post vital signs: Reviewed and stable  Last Vitals:  Vitals Value Taken Time  BP 127/86 03/22/2018 12:07 PM  Temp    Pulse 67 03/22/2018 12:14 PM  Resp 19 03/22/2018 12:14 PM  SpO2 100 % 03/22/2018 12:14 PM  Vitals shown include unvalidated device data.  Last Pain:  Vitals:   03/22/18 0614  PainSc: 2          Complications: No apparent anesthesia complications

## 2018-03-22 NOTE — Anesthesia Procedure Notes (Signed)
Procedure Name: Intubation Date/Time: 03/22/2018 7:57 AM Performed by: Jearld Pies, CRNA Pre-anesthesia Checklist: Patient identified, Emergency Drugs available, Suction available and Patient being monitored Patient Re-evaluated:Patient Re-evaluated prior to induction Oxygen Delivery Method: Circle System Utilized Preoxygenation: Pre-oxygenation with 100% oxygen Induction Type: IV induction, Rapid sequence and Cricoid Pressure applied Laryngoscope Size: Miller and 2 Grade View: Grade I Tube type: Oral Tube size: 7.5 mm Number of attempts: 1 Airway Equipment and Method: Stylet and Oral airway Placement Confirmation: ETT inserted through vocal cords under direct vision,  positive ETCO2 and breath sounds checked- equal and bilateral Secured at: 22 cm Tube secured with: Tape Dental Injury: Teeth and Oropharynx as per pre-operative assessment

## 2018-03-22 NOTE — Anesthesia Postprocedure Evaluation (Signed)
Anesthesia Post Note  Patient: [employer]  Procedure(s) Performed: STEREOTACTIC RIGHT PTERIONAL CRANIOTOMY, RESECTION OF CRANIOPHARYNGIOMA (Right ) APPLICATION OF CRANIAL NAVIGATION (Right )     Patient location during evaluation: PACU Anesthesia Type: General Level of consciousness: awake and alert Pain management: pain level controlled Vital Signs Assessment: post-procedure vital signs reviewed and stable Respiratory status: spontaneous breathing, nonlabored ventilation, respiratory function stable and patient connected to nasal cannula oxygen Cardiovascular status: blood pressure returned to baseline and stable Postop Assessment: no apparent nausea or vomiting Anesthetic complications: no    Last Vitals:  Vitals:   03/22/18 1400 03/22/18 1500  BP: 108/65 108/68  Pulse: 64 65  Resp: 18 16  Temp:    SpO2: 100% 100%    Last Pain:  Vitals:   03/22/18 1356  PainSc: 7                  Ayzia Day COKER

## 2018-03-23 ENCOUNTER — Inpatient Hospital Stay (HOSPITAL_COMMUNITY): Payer: BLUE CROSS/BLUE SHIELD

## 2018-03-23 LAB — BASIC METABOLIC PANEL
Anion gap: 4 — ABNORMAL LOW (ref 5–15)
Anion gap: 5 (ref 5–15)
BUN: 13 mg/dL (ref 6–20)
BUN: 13 mg/dL (ref 6–20)
CHLORIDE: 128 mmol/L — AB (ref 98–111)
CHLORIDE: 126 mmol/L — AB (ref 98–111)
CO2: 27 mmol/L (ref 22–32)
CO2: 26 mmol/L (ref 22–32)
CREATININE: 1.19 mg/dL — AB (ref 0.44–1.00)
CREATININE: 1.08 mg/dL — AB (ref 0.44–1.00)
Calcium: 9.4 mg/dL (ref 8.9–10.3)
Calcium: 9.5 mg/dL (ref 8.9–10.3)
GFR calc Af Amer: >60 mL/min (ref >60)
GFR calc non Af Amer: 56 mL/min — ABNORMAL LOW (ref >60)
GFR calc non Af Amer: >60 mL/min (ref >60)
GLUCOSE: 145 mg/dL — AB (ref 70–99)
GLUCOSE: 122 mg/dL — AB (ref 70–99)
Potassium: 3.7 mmol/L (ref 3.5–5.1)
Potassium: 3.3 mmol/L — ABNORMAL LOW (ref 3.5–5.1)
Sodium: 159 mmol/L — ABNORMAL HIGH (ref 135–145)
Sodium: 157 mmol/L — ABNORMAL HIGH (ref 135–145)

## 2018-03-23 LAB — BASIC METABOLIC PANEL WITH GFR
Anion gap: 6 (ref 5–15)
BUN: 10 mg/dL (ref 6–20)
CO2: 24 mmol/L (ref 22–32)
Calcium: 11.1 mg/dL — ABNORMAL HIGH (ref 8.9–10.3)
Chloride: 130 mmol/L — ABNORMAL HIGH (ref 98–111)
Creatinine, Ser: 1.22 mg/dL — ABNORMAL HIGH (ref 0.44–1.00)
GFR calc Af Amer: >60 mL/min (ref >60)
GFR calc non Af Amer: 55 mL/min — ABNORMAL LOW (ref >60)
Glucose, Bld: 157 mg/dL — ABNORMAL HIGH (ref 70–99)
Potassium: 3.7 mmol/L (ref 3.5–5.1)
Sodium: 160 mmol/L — ABNORMAL HIGH (ref 135–145)

## 2018-03-23 LAB — POCT URINE SPECIFIC GRAVITY, REFRACTOMETER: Specific gravity, refractometer-urine: 1.0189 (ref 1.001–1.035)

## 2018-03-23 LAB — CORTISOL-AM, BLOOD: Cortisol - AM: 1.8 ug/dL — ABNORMAL LOW (ref 6.7–22.6)

## 2018-03-23 MED ORDER — DEXTROSE 5 % IV SOLN
INTRAVENOUS | Status: AC
  Administered 2018-03-23: 09:00:00 via INTRAVENOUS

## 2018-03-23 MED ORDER — HYDROCORTISONE NA SUCCINATE PF 100 MG IJ SOLR
25.0000 mg | Freq: Once | INTRAMUSCULAR | Status: AC
  Administered 2018-03-23: 25 mg via INTRAVENOUS
  Filled 2018-03-23: qty 2

## 2018-03-23 MED ORDER — SODIUM CHLORIDE 0.9 % IV SOLN
INTRAVENOUS | Status: DC | PRN
  Administered 2018-03-24: 250 mL via INTRAVENOUS

## 2018-03-23 MED ORDER — SODIUM CHLORIDE 0.9 % IV SOLN
2.0000 ug | Freq: Two times a day (BID) | INTRAVENOUS | Status: DC
  Administered 2018-03-23 – 2018-03-25 (×6): 2 ug via INTRAVENOUS
  Filled 2018-03-23 (×8): qty 0.5

## 2018-03-23 MED ORDER — SODIUM CHLORIDE 0.45 % IV BOLUS
2000.0000 mL | Freq: Once | INTRAVENOUS | Status: AC
  Administered 2018-03-23: 2000 mL via INTRAVENOUS

## 2018-03-23 MED ORDER — ORAL CARE MOUTH RINSE
15.0000 mL | Freq: Two times a day (BID) | OROMUCOSAL | Status: DC
  Administered 2018-03-24 – 2018-03-27 (×5): 15 mL via OROMUCOSAL

## 2018-03-23 MED ORDER — DEXTROSE 5 % IV SOLN
INTRAVENOUS | Status: DC
  Administered 2018-03-23 – 2018-03-24 (×3): via INTRAVENOUS

## 2018-03-23 NOTE — Progress Notes (Signed)
  CRITICAL VALUE ALERT  Critical Value:  Na+: 166, Cl-: not reading because it is so high, and cortisol: 1.8  Date & Time Notied:  03/23/2018 @1445   Provider Notified: Trinda Pascal, NP and Dr Annette Stable  Orders Received/Actions taken: Redraw labs and follow up after the results.

## 2018-03-23 NOTE — Progress Notes (Addendum)
Notified Meghan, NP with neurosurgery about patients increase in agitation and restlessness this AM. Pt's Na+ 160. Neurosurgery is coming to assess patient for morning rounds. Will continue to monitor patient.

## 2018-03-23 NOTE — Progress Notes (Signed)
Patient constantly bending elbows and blocking flow of IV fluid, despite reorienting and constant reminders to not bend left elbow. Patient keeps removing turban dressing on head. Patient attempting to remove IV lines. Patient becoming more agitated. Patient safety mitts applied. Will continue to monitor.

## 2018-03-23 NOTE — Progress Notes (Signed)
At MRI at 0400. Patient assessed upon arrival back to unit

## 2018-03-23 NOTE — Progress Notes (Signed)
Patient more confused and agitated this morning.  She will follow commands with great prompting.  Strength equal.  No longer able to keep up her fluid status with oral intake.  Afebrile.  Vital signs are stable.  Urine output consistent with diabetes insipidus.Fluid balance markedly negative.  Pupils equal.  Vision appears intact.  No afferent pupillary defect.  Strength equal bilaterally.  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  Patient with significant diabetes insipidus.  DDAVP started.  We will begin efforts at rehydration.

## 2018-03-23 NOTE — Progress Notes (Signed)
Notified Meghan, NP of patient's lab results after they were drawn again. Patient has remained confused and agitated throughout the shift, which neurosurgery NP and MD are aware. Will continue to monitor.

## 2018-03-23 NOTE — Progress Notes (Signed)
At MRI during 0400 vitals

## 2018-03-24 LAB — BASIC METABOLIC PANEL
ANION GAP: 5 (ref 5–15)
ANION GAP: 7 (ref 5–15)
Anion gap: 9 (ref 5–15)
Anion gap: 8 (ref 5–15)
Anion gap: 7 (ref 5–15)
Anion gap: 7 (ref 5–15)
BUN: 11 mg/dL (ref 6–20)
BUN: 11 mg/dL (ref 6–20)
BUN: 12 mg/dL (ref 6–20)
BUN: 12 mg/dL (ref 6–20)
BUN: 13 mg/dL (ref 6–20)
BUN: 13 mg/dL (ref 6–20)
CALCIUM: 10 mg/dL (ref 8.9–10.3)
CHLORIDE: 121 mmol/L — AB (ref 98–111)
CO2: 26 mmol/L (ref 22–32)
CO2: 27 mmol/L (ref 22–32)
CO2: 24 mmol/L (ref 22–32)
CO2: 25 mmol/L (ref 22–32)
CO2: 23 mmol/L (ref 22–32)
CO2: 24 mmol/L (ref 22–32)
CREATININE: 0.94 mg/dL (ref 0.44–1.00)
Calcium: 9.6 mg/dL (ref 8.9–10.3)
Calcium: 9.6 mg/dL (ref 8.9–10.3)
Calcium: 9.9 mg/dL (ref 8.9–10.3)
Calcium: 9.4 mg/dL (ref 8.9–10.3)
Calcium: 9.1 mg/dL (ref 8.9–10.3)
Chloride: 124 mmol/L — ABNORMAL HIGH (ref 98–111)
Chloride: 118 mmol/L — ABNORMAL HIGH (ref 98–111)
Chloride: 115 mmol/L — ABNORMAL HIGH (ref 98–111)
Chloride: 113 mmol/L — ABNORMAL HIGH (ref 98–111)
Chloride: 109 mmol/L (ref 98–111)
Creatinine, Ser: 1.01 mg/dL — ABNORMAL HIGH (ref 0.44–1.00)
Creatinine, Ser: 0.93 mg/dL (ref 0.44–1.00)
Creatinine, Ser: 0.94 mg/dL (ref 0.44–1.00)
Creatinine, Ser: 0.88 mg/dL (ref 0.44–1.00)
Creatinine, Ser: 0.92 mg/dL (ref 0.44–1.00)
GFR calc Af Amer: >60 mL/min (ref >60)
GFR calc Af Amer: >60 mL/min (ref >60)
GFR calc Af Amer: >60 mL/min (ref >60)
GFR calc Af Amer: >60 mL/min (ref >60)
GFR calc Af Amer: >60 mL/min (ref >60)
GFR calc non Af Amer: >60 mL/min (ref >60)
GFR calc non Af Amer: >60 mL/min (ref >60)
GFR calc non Af Amer: >60 mL/min (ref >60)
GLUCOSE: 107 mg/dL — AB (ref 70–99)
GLUCOSE: 113 mg/dL — AB (ref 70–99)
GLUCOSE: 113 mg/dL — AB (ref 70–99)
Glucose, Bld: 97 mg/dL (ref 70–99)
Glucose, Bld: 117 mg/dL — ABNORMAL HIGH (ref 70–99)
Glucose, Bld: 98 mg/dL (ref 70–99)
POTASSIUM: 2.9 mmol/L — AB (ref 3.5–5.1)
POTASSIUM: 3 mmol/L — AB (ref 3.5–5.1)
POTASSIUM: 3.2 mmol/L — AB (ref 3.5–5.1)
POTASSIUM: 3.2 mmol/L — AB (ref 3.5–5.1)
POTASSIUM: 3.7 mmol/L (ref 3.5–5.1)
Potassium: 4.3 mmol/L (ref 3.5–5.1)
SODIUM: 150 mmol/L — AB (ref 135–145)
SODIUM: 150 mmol/L — AB (ref 135–145)
SODIUM: 144 mmol/L (ref 135–145)
SODIUM: 140 mmol/L (ref 135–145)
Sodium: 155 mmol/L — ABNORMAL HIGH (ref 135–145)
Sodium: 153 mmol/L — ABNORMAL HIGH (ref 135–145)

## 2018-03-24 LAB — POCT URINE SPECIFIC GRAVITY, REFRACTOMETER
SP GRAV REFRACT UR: 1.0209 (ref 1.001–1.035)
Specific gravity, refractometer-urine: 1.0224 (ref 1.001–1.035)

## 2018-03-24 MED ORDER — DEXTROSE-NACL 5-0.45 % IV SOLN
INTRAVENOUS | Status: DC
  Administered 2018-03-24 – 2018-03-30 (×15): via INTRAVENOUS

## 2018-03-24 NOTE — Progress Notes (Signed)
Patient sniffily better today.  She remains somewhat agitated.  Her confusion is better.  She awakens to voice.  She states her name and follow simple commands bilaterally.  Motor and sensory function appears intact bilaterally.  Wound clean and dry.  Status post resection of craniopharyngioma.  Patient with postoperative diabetes insipidus.  Continue treatment of DI with IV DDAVP.  Change maintenance fluid.

## 2018-03-25 LAB — BASIC METABOLIC PANEL
ANION GAP: 10 (ref 5–15)
ANION GAP: 6 (ref 5–15)
ANION GAP: 7 (ref 5–15)
ANION GAP: 8 (ref 5–15)
Anion gap: 11 (ref 5–15)
Anion gap: 12 (ref 5–15)
BUN: 10 mg/dL (ref 6–20)
BUN: 6 mg/dL (ref 6–20)
BUN: 7 mg/dL (ref 6–20)
BUN: 7 mg/dL (ref 6–20)
BUN: 9 mg/dL (ref 6–20)
BUN: 9 mg/dL (ref 6–20)
CALCIUM: 9.3 mg/dL (ref 8.9–10.3)
CALCIUM: 9.4 mg/dL (ref 8.9–10.3)
CHLORIDE: 108 mmol/L (ref 98–111)
CO2: 23 mmol/L (ref 22–32)
CO2: 24 mmol/L (ref 22–32)
CO2: 24 mmol/L (ref 22–32)
CO2: 25 mmol/L (ref 22–32)
CO2: 26 mmol/L (ref 22–32)
CO2: 26 mmol/L (ref 22–32)
Calcium: 9.2 mg/dL (ref 8.9–10.3)
Calcium: 9 mg/dL (ref 8.9–10.3)
Calcium: 9.3 mg/dL (ref 8.9–10.3)
Calcium: 9.3 mg/dL (ref 8.9–10.3)
Chloride: 104 mmol/L (ref 98–111)
Chloride: 108 mmol/L (ref 98–111)
Chloride: 109 mmol/L (ref 98–111)
Chloride: 111 mmol/L (ref 98–111)
Chloride: 99 mmol/L (ref 98–111)
Creatinine, Ser: 0.84 mg/dL (ref 0.44–1.00)
Creatinine, Ser: 0.87 mg/dL (ref 0.44–1.00)
Creatinine, Ser: 0.88 mg/dL (ref 0.44–1.00)
Creatinine, Ser: 0.88 mg/dL (ref 0.44–1.00)
Creatinine, Ser: 0.92 mg/dL (ref 0.44–1.00)
Creatinine, Ser: 0.93 mg/dL (ref 0.44–1.00)
GFR calc Af Amer: >60 mL/min (ref >60)
GFR calc Af Amer: >60 mL/min (ref >60)
GFR calc Af Amer: >60 mL/min (ref >60)
GFR calc Af Amer: >60 mL/min (ref >60)
GFR calc Af Amer: >60 mL/min (ref >60)
GFR calc Af Amer: >60 mL/min (ref >60)
GFR calc non Af Amer: >60 mL/min (ref >60)
GLUCOSE: 107 mg/dL — AB (ref 70–99)
GLUCOSE: 118 mg/dL — AB (ref 70–99)
GLUCOSE: 122 mg/dL — AB (ref 70–99)
GLUCOSE: 122 mg/dL — AB (ref 70–99)
Glucose, Bld: 110 mg/dL — ABNORMAL HIGH (ref 70–99)
Glucose, Bld: 119 mg/dL — ABNORMAL HIGH (ref 70–99)
POTASSIUM: 2.7 mmol/L — AB (ref 3.5–5.1)
POTASSIUM: 2.9 mmol/L — AB (ref 3.5–5.1)
POTASSIUM: 2.9 mmol/L — AB (ref 3.5–5.1)
POTASSIUM: 3.1 mmol/L — AB (ref 3.5–5.1)
POTASSIUM: 3.2 mmol/L — AB (ref 3.5–5.1)
Potassium: 2.9 mmol/L — ABNORMAL LOW (ref 3.5–5.1)
Sodium: 134 mmol/L — ABNORMAL LOW (ref 135–145)
Sodium: 139 mmol/L (ref 135–145)
Sodium: 141 mmol/L (ref 135–145)
Sodium: 142 mmol/L (ref 135–145)
Sodium: 142 mmol/L (ref 135–145)
Sodium: 143 mmol/L (ref 135–145)

## 2018-03-25 LAB — POCT I-STAT 7, (LYTES, BLD GAS, ICA,H+H)
Acid-Base Excess: 1 mmol/L (ref 0.0–2.0)
Bicarbonate: 27 mmol/L (ref 20.0–28.0)
Calcium, Ion: 1.12 mmol/L — ABNORMAL LOW (ref 1.15–1.40)
HCT: 35 % — ABNORMAL LOW (ref 36.0–46.0)
HEMOGLOBIN: 11.9 g/dL — AB (ref 12.0–15.0)
O2 SAT: 100 %
PCO2 ART: 44.8 mmHg (ref 32.0–48.0)
PO2 ART: 205 mmHg — AB (ref 83.0–108.0)
POTASSIUM: 2.8 mmol/L — AB (ref 3.5–5.1)
Sodium: 140 mmol/L (ref 135–145)
TCO2: 28 mmol/L (ref 22–32)
pH, Arterial: 7.381 (ref 7.350–7.450)

## 2018-03-25 MED ORDER — POTASSIUM CHLORIDE 10 MEQ/100ML IV SOLN
10.0000 meq | INTRAVENOUS | Status: AC
  Administered 2018-03-25 (×6): 10 meq via INTRAVENOUS
  Filled 2018-03-25 (×5): qty 100

## 2018-03-25 MED ORDER — ENSURE ENLIVE PO LIQD
237.0000 mL | Freq: Two times a day (BID) | ORAL | Status: DC
  Administered 2018-03-29: 237 mL via ORAL

## 2018-03-25 NOTE — Progress Notes (Addendum)
Initial Nutrition Assessment  DOCUMENTATION CODES:   Not applicable  INTERVENTION:    Ensure Enlive po BID, each supplement provides 350 kcal and 20 grams of protein  Magic cup BID with meals, each supplement provides 290 kcal and 9 grams of protein  NUTRITION DIAGNOSIS:   Inadequate oral intake related to lethargy/confusion as evidenced by meal completion < 25%.  GOAL:   Patient will meet greater than or equal to 90% of their needs  MONITOR:   PO intake, Supplement acceptance, Diet advancement, Labs, Weight trends, I & O's  REASON FOR ASSESSMENT:   Consult Assessment of nutrition requirement/status  ASSESSMENT:   Patient with PMH significant for anxiety, IBS, GERD, benign tumor of pituitary gland, and cervicogenic headache. In 03/2018, pt underwent transphenoidal approach for resection but due to significant venous bleeding, surgery was cancelled. Presents this admission for surgical resection via craniotomy.    10/4- resection of craniopharyngioma  Per neurosurgery pt with high urine output consistent with postoperative diabetes insipidus. Currently on IVF.   Pt unable to hold a conversation at time of visit. Spoke with family member at bedside who denies pt had any loss in appetite PTA. States she typically eats 3 meals with snacks daily. The day prior to surgery pt attended a low country seafood boil and ate a normal amount. Since surgery pt has consumed very little PO. She asked for Ginger ale this morning but refused to eat breakfast likely due to lethargy. Discussed supplements with family and encouraged PO intake. Briefly dicussed the possibility of nutrition support if PO intake remains poor.   Family member endorses pt's UBW stays around 180-185 lb and denies any recent wt loss. Weight records reflect pt has maintained her weight for 10 months. Nutrition-Focused physical exam completed.   I/O: -6 L since admit UOP: 1100 ml x 24 hrs  Medications reviewed and  include: colace, D5 @ 125 ml/hr, desmopressin, KCl- d/c Labs reviewed: K 2.9 (L)   NUTRITION - FOCUSED PHYSICAL EXAM:    Most Recent Value  Orbital Region  No depletion  Upper Arm Region  No depletion  Thoracic and Lumbar Region  No depletion  Buccal Region  No depletion  Temple Region  No depletion  Clavicle Bone Region  No depletion  Clavicle and Acromion Bone Region  No depletion  Scapular Bone Region  No depletion  Dorsal Hand  No depletion  Patellar Region  No depletion  Anterior Thigh Region  No depletion  Posterior Calf Region  No depletion  Edema (RD Assessment)  Mild [facial swelling]     Diet Order:   Diet Order            Diet Heart Room service appropriate? Yes; Fluid consistency: Thin  Diet effective now              EDUCATION NEEDS:   Not appropriate for education at this time  Skin:  Skin Assessment: Skin Integrity Issues: Skin Integrity Issues:: Incisions Incisions: closed- right head  Last BM:  03/22/18  Height:   Ht Readings from Last 1 Encounters:  03/22/18 5\' 7"  (1.702 m)    Weight:   Wt Readings from Last 1 Encounters:  03/22/18 82.2 kg    Ideal Body Weight:  61.4 kg  BMI:  Body mass index is 28.38 kg/m.  Estimated Nutritional Needs:   Kcal:  1650-1850 kcal  Protein:  85-100 grams  Fluid:  >/= 1.6 L/day   Mariana Single RD, LDN Clinical Nutrition Pager # - [phone]

## 2018-03-25 NOTE — Progress Notes (Signed)
CRITICAL VALUE ALERT  Critical Value:  Potassium 2.7  Date & Time Notified: 0828 Provider Notified: [place]  Orders Received/Actions taken: New orders received and carried out. Will continue to monitor. Lianne Bushy RN BSN

## 2018-03-25 NOTE — Progress Notes (Signed)
  NEUROSURGERY PROGRESS NOTE   No issues overnight, remains somewhat drowsy  EXAM:  BP (!) 141/105   Pulse 65   Temp 98.3 F (36.8 C) (Oral)   Resp 14   Ht 5\' 7"  (1.702 m)   Wt 82.2 kg   SpO2 100%   BMI 28.38 kg/m   Somnolent but arouses Says her name, doesn't answer other questions CN grossly intact  Follows commands, moves all extremities purposefully Wound c/d/i  IMPRESSION:  42 y.o. 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 Postop exacerbation of DI.  PLAN: - Cont ddAVP, monitor q4 Na - OOB

## 2018-03-25 NOTE — Op Note (Signed)
NEUROSURGERY OPERATIVE NOTE   PREOP DIAGNOSIS:  1. Sellar/Suprasellar tumor   POSTOP DIAGNOSIS: Same  PROCEDURE: 1. Stereotactic right pterional craniotomy for resection of suprasellar tumor 2. Use of intraoperative microscope for microdissection.  SURGEON: Dr. Consuella Lose, MD  ASSISTANT: Ferne Reus, PA-C  ANESTHESIA: General Endotracheal  EBL: 200cc  SPECIMENS: Suprasellar tumor, Cyst wall for permanent pathology  DRAINS: None  COMPLICATIONS: None immediate  CONDITION: Hemodynamically stable to PACU  HISTORY: Cindy Lamb is a 42 y.o. 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  She has been followed by endocrinology and has been on hormone replacement including Synthroid, steroids, and DDAVP.  We attempted endoscopic trssphenoidal resection of the tumor which was aborted due to significant bleeding from the dura overlying the sella.  She made an excellent recovery from the attempted transsphenoidal procedure.  After treatment options were discussed, she elected to proceed with craniotomy for resection of the tumor.  The risks and benefits of the surgery were reviewed in detail with both the patient and her family.  After all questions were answered informed consent was obtained and witnessed.  PROCEDURE IN DETAIL: The patient was brought to the operating room. After induction of general anesthesia, the patient was positioned on the operative table in the [place] head holder in the supine position. All pressure points were meticulously padded.  Preoperative MRI scan was then co-registered with surface markers until a satisfactory accuracy was achieved.  A standard right sided pterional skin incision was then marked out and prepped and draped in the usual sterile fashion.  After timeout was conducted, the skin incision was infiltrated with local anesthetic with epinephrine.  Incision was then made sharply and carried  down through the galea.  Hemostasis on the skin edges was achieved with Raney clips.  The superficial temporal fascia and the deep temporal fat pad were then elevated from the deep temporal fascia, and the skin flap with the fat pad was retracted anteriorly.  The temporalis fascia was then incised along the superior temporal line and reflected inferiorly.  Standard frontotemporal craniotomy flap was then created with bur hole in craniotome and elevated.  The lesser wing of the sphenoid was then drilled down to allow direct visualization of the skull base structures.  The dura was then opened in a curvilinear fashion and tacked anteriorly.  At this point the microscope was draped sterilely and brought into the field, and the remainder of the case was done under the microscope using microdissection.  Initially, a subfrontal approach was employed to identify the olfactory nerve, the right optic nerve, and the optical carotid cistern was opened.  Dissection was then carried laterally to identify the internal carotid artery.  I then split a portion of the medial sylvian fissure in order to further retract the frontal lobe.  We then identified the internal carotid artery bifurcation as well as the right A1.  Dissection was then carried medially, and I identified the tumor arising from the sella which appeared to be displacing the optic chiasm superiorly.  Dissection was carried further laterally to identify the contralateral internal carotid artery and optic nerve.  At this point, the dissection was turned again more laterally, on the lateral edge of the carotid.  I did identify the free edge of the tentorium.  The 3rd nerve was identified.  The carotid oculomotor cistern was opened.  We then began dissection of the cyst wall away from the optic nerve, carotid artery, and optic chiasm.  While there did appear to be a good arachnoid plane between the cyst wall and the optic nerves bilaterally, internal carotid arteries  bilaterally, he did appear to be significantly adherent to the undersurface of the optic chiasm.  The cyst was entered, and multiple fragments of white chalky substance was encountered and removed.  This did allow some decompression of the cyst.  And allowed further dissection of the cyst wall away from the neurovascular structures.  Ultimately, I was able to utilize the carotid oculomotor cistern in order to visualize the posterior extent of the cyst wall, it did appear to preserve the Lilliquist membrane, and was not extending into the posterior fossa proper.  Cyst was dissected posteriorly, and was truncated about its adherent portion to the inferior part of the optic chiasm.  The cyst wall was then sent for permanent pathology.  I coagulated the remaining portion of the cyst wall adherent to the undersurface of the optic chiasm.  At this point, the resection cavity was inspected, I did not identify any residual cyst wall.  There was no active bleeding identified.  The right internal carotid artery did appear to be somewhat spasmodic.  After hemostasis was achieved, I irrigated the field with normal saline with papaverine.  A small piece of Gelfoam soaked in papaverine was also used to cover the internal carotid artery.  At this point, the dura was reapproximated with interrupted 4-0 Nurolon stitches.  A piece of DuraGen was used as a onlay graft.  The bone was then replaced with standard titanium plates and screws.  The temporalis muscle and fascia was reapproximated with interrupted 0 Vicryl stitches.  The galea was closed with interrupted 3-0 Vicryl stitches and the skin was closed with skin staples.  Bacitracin ointment and sterile dressing was applied.  The patient was then transferred to the stretcher and removed from the [place] head holder.  She was extubated and taken to the postanesthesia care unit in stable hemodynamic condition.  At the end of the case all sponge, needle, instrument, and  cottonoid counts are correct.

## 2018-03-25 NOTE — Progress Notes (Signed)
Received call from nursing regarding most recent BMP.  BMP Latest Ref Rng & Units 03/25/2018 03/25/2018 03/25/2018  Glucose 70 - 99 mg/dL 119(H) 110(H) 118(H)  BUN 6 - 20 mg/dL 9 9 10   Creatinine 0.44 - 1.00 mg/dL 0.88 0.88 0.84  Sodium 135 - 145 mmol/L 141 142 143  Potassium 3.5 - 5.1 mmol/L 2.7(LL) 3.2(L) 2.9(L)  Chloride 98 - 111 mmol/L 109 108 111  CO2 22 - 32 mmol/L 25 24 26   Calcium 8.9 - 10.3 mg/dL 9.0 9.2 9.4   Potassium at 2.7. Orders entered for KCl runs. Has scheduled BMP q 4 hours for monitoring of Na due to DI. Will reassess BMP after runs of KCl (8 hours from now).

## 2018-03-26 ENCOUNTER — Encounter (HOSPITAL_COMMUNITY): Payer: Self-pay | Admitting: Neurosurgery

## 2018-03-26 LAB — BASIC METABOLIC PANEL
ANION GAP: 12 (ref 5–15)
ANION GAP: 10 (ref 5–15)
ANION GAP: 9 (ref 5–15)
Anion gap: 11 (ref 5–15)
Anion gap: 8 (ref 5–15)
BUN: 5 mg/dL — ABNORMAL LOW (ref 6–20)
BUN: <5 mg/dL — ABNORMAL LOW (ref 6–20)
BUN: 5 mg/dL — ABNORMAL LOW (ref 6–20)
BUN: 5 mg/dL — AB (ref 6–20)
BUN: 6 mg/dL (ref 6–20)
CALCIUM: 9.1 mg/dL (ref 8.9–10.3)
CALCIUM: 9.2 mg/dL (ref 8.9–10.3)
CALCIUM: 9 mg/dL (ref 8.9–10.3)
CALCIUM: 9 mg/dL (ref 8.9–10.3)
CO2: 23 mmol/L (ref 22–32)
CO2: 26 mmol/L (ref 22–32)
CO2: 23 mmol/L (ref 22–32)
CO2: 21 mmol/L — AB (ref 22–32)
CO2: 20 mmol/L — AB (ref 22–32)
Calcium: 9.3 mg/dL (ref 8.9–10.3)
Chloride: 97 mmol/L — ABNORMAL LOW (ref 98–111)
Chloride: 96 mmol/L — ABNORMAL LOW (ref 98–111)
Chloride: 94 mmol/L — ABNORMAL LOW (ref 98–111)
Chloride: 96 mmol/L — ABNORMAL LOW (ref 98–111)
Chloride: 96 mmol/L — ABNORMAL LOW (ref 98–111)
Creatinine, Ser: 0.85 mg/dL (ref 0.44–1.00)
Creatinine, Ser: 0.94 mg/dL (ref 0.44–1.00)
Creatinine, Ser: 0.94 mg/dL (ref 0.44–1.00)
Creatinine, Ser: 0.9 mg/dL (ref 0.44–1.00)
Creatinine, Ser: 0.83 mg/dL (ref 0.44–1.00)
GFR calc Af Amer: >60 mL/min (ref >60)
GFR calc Af Amer: >60 mL/min (ref >60)
GFR calc non Af Amer: >60 mL/min (ref >60)
GFR calc non Af Amer: >60 mL/min (ref >60)
GFR calc non Af Amer: >60 mL/min (ref >60)
GLUCOSE: 113 mg/dL — AB (ref 70–99)
GLUCOSE: 114 mg/dL — AB (ref 70–99)
GLUCOSE: 111 mg/dL — AB (ref 70–99)
GLUCOSE: 108 mg/dL — AB (ref 70–99)
Glucose, Bld: 113 mg/dL — ABNORMAL HIGH (ref 70–99)
Potassium: 2.9 mmol/L — ABNORMAL LOW (ref 3.5–5.1)
Potassium: 2.7 mmol/L — CL (ref 3.5–5.1)
Potassium: 3 mmol/L — ABNORMAL LOW (ref 3.5–5.1)
Potassium: 2.9 mmol/L — ABNORMAL LOW (ref 3.5–5.1)
Potassium: 2.8 mmol/L — ABNORMAL LOW (ref 3.5–5.1)
Sodium: 132 mmol/L — ABNORMAL LOW (ref 135–145)
Sodium: 132 mmol/L — ABNORMAL LOW (ref 135–145)
Sodium: 128 mmol/L — ABNORMAL LOW (ref 135–145)
Sodium: 125 mmol/L — ABNORMAL LOW (ref 135–145)
Sodium: 125 mmol/L — ABNORMAL LOW (ref 135–145)

## 2018-03-26 MED ORDER — POTASSIUM CHLORIDE 10 MEQ/100ML IV SOLN
10.0000 meq | INTRAVENOUS | Status: AC
  Administered 2018-03-26 (×6): 10 meq via INTRAVENOUS
  Filled 2018-03-26 (×6): qty 100

## 2018-03-26 MED ORDER — SODIUM CHLORIDE 0.9 % IV SOLN
1.0000 ug | Freq: Two times a day (BID) | INTRAVENOUS | Status: DC
  Administered 2018-03-26 (×2): 1 ug via INTRAVENOUS
  Filled 2018-03-26 (×4): qty 0.25

## 2018-03-26 NOTE — Evaluation (Signed)
Physical Therapy Evaluation Patient Details Name: Cindy Lamb [facility] MRN: [id number] DOB: 1976/02/24 Today's Date: 03/26/2018   History of Present Illness  42 y.o. female known cystic sellar/suprasellar mass mass. Underwent Stereotactic right pterional craniotomy for resection of suprasellar tumor 10/4.   Clinical Impression  Pt admitted with/for tumor resection.  Pt presently restless, moving all 4's non purposefully and needing heavy max assist at times for basic mobility.  Pt currently limited functionally due to the problems listed. ( See problems list.)   Pt will benefit from PT to maximize function and safety in order to get ready for next venue listed below.     Follow Up Recommendations CIR;Other (comment)(unless is slow to reach appropriate level then may need SNF)    Equipment Recommendations  Other (comment)(TBA)    Recommendations for Other Services       Precautions / Restrictions Precautions Precautions: Fall Restrictions Weight Bearing Restrictions: No      Mobility  Bed Mobility Overal bed mobility: Needs Assistance Bed Mobility: Rolling;Sidelying to Sit Rolling: Max assist;+2 for physical assistance Sidelying to sit: Max assist;+2 for physical assistance       General bed mobility comments: significant truncal assist, tactile cues to direct pt through normal sequencing.  hand over hand w/bearing and facilitation of push up with R UE  Transfers Overall transfer level: Needs assistance   Transfers: Sit to/from Stand Sit to Stand: Max assist;+2 physical assistance         General transfer comment: pt heavily biased to the right into active flexion, no purposeful attempt to w/bear on the L LE  Ambulation/Gait                Stairs            Wheelchair Mobility    Modified Rankin (Stroke Patients Only)       Balance Overall balance assessment: Needs assistance Sitting-balance support: Feet supported;Single extremity supported;Bilateral  upper extremity supported Sitting balance-Leahy Scale: Zero Sitting balance - Comments: heavy flexed lean to the right.  Increased pushing left with R UE when pt is brought into midline.  Pt's restlessness/agitation increased when pt help in midline. Postural control: Left lateral lean Standing balance support: During functional activity;Bilateral upper extremity supported Standing balance-Leahy Scale: Zero Standing balance comment: heavy lean L with no purposeful w/bearing on the L LE                             Pertinent Vitals/Pain Pain Assessment: Faces Faces Pain Scale: Hurts little more Pain Location: difficult to determine, likely head and neck due to position causing grimace Pain Descriptors / Indicators: Grimacing(crying out ouch) Pain Intervention(s): Monitored during session;Premedicated before session;Repositioned;Limited activity within patient's tolerance    Home Living Family/patient expects to be discharged to:: Private residence Living Arrangements: Children Available Help at Discharge: Family;Other (Comment)(dtr taking FMLA to help out after rehab.) Type of Home: Apartment Home Access: Level entry;Other (comment)(1 curb step)     Home Layout: One level Home Equipment: None Additional Comments: Information shared by pt's daughter's best friend    Prior Function Level of Independence: Independent         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   Independent with ADL's/ iADL's     Hand Dominance   Dominant Hand: Right    Extremity/Trunk Assessment   Upper Extremity Assessment Upper Extremity Assessment: Defer to OT evaluation    Lower Extremity Assessment Lower Extremity  Assessment: RLE deficits/detail;LLE deficits/detail RLE Deficits / Details: moving spontaneously, generally not purposeful.  pt able to maintain standing on R LE RLE Coordination: decreased fine motor LLE Deficits / Details: spontaneous movement noted, non purposeful,  did not accept weight on L LE in standing    Cervical / Trunk Assessment Cervical / Trunk Assessment: Other exceptions(head biased into right rotation and slightly left lat. tilt.)  Communication   Communication: Expressive difficulties;Receptive difficulties  Cognition Arousal/Alertness: Lethargic Behavior During Therapy: Restless;Agitated Overall Cognitive Status: Impaired/Different from baseline Area of Impairment: Orientation;Attention;Following commands;Awareness;Problem solving;Safety/judgement                   Current Attention Level: Focused   Following Commands: Follows one step commands inconsistently Safety/Judgement: Decreased awareness of deficits;Decreased awareness of safety Awareness: Intellectual Problem Solving: (unable)        General Comments General comments (skin integrity, edema, etc.): pt's max witnessed HR was 162 bpm.  pt's daughter's best friend present to give insights into pt's PLOF and witness the therapy session.    Exercises     Assessment/Plan    PT Assessment Patient needs continued PT services  PT Problem List Decreased strength;Decreased activity tolerance;Decreased balance;Decreased mobility;Decreased coordination;Decreased safety awareness;Cardiopulmonary status limiting activity;Pain       PT Treatment Interventions Gait training;DME instruction;Functional mobility training;Therapeutic activities;Balance training;Neuromuscular re-education;Patient/family education    PT Goals (Current goals can be found in the Care Plan section)  Acute Rehab PT Goals Patient Stated Goal: pt unable PT Goal Formulation: Patient unable to participate in goal setting Time For Goal Achievement: 04/09/18 Potential to Achieve Goals: Fair    Frequency Min 4X/week   Barriers to discharge        Co-evaluation               AM-PAC PT "6 Clicks" Daily Activity  Outcome Measure Difficulty turning over in bed (including adjusting bedclothes,  sheets and blankets)?: Unable Difficulty moving from lying on back to sitting on the side of the bed? : Unable Difficulty sitting down on and standing up from a chair with arms (e.g., wheelchair, bedside commode, etc,.)?: Unable Help needed moving to and from a bed to chair (including a wheelchair)?: Total Help needed walking in hospital room?: Total Help needed climbing 3-5 steps with a railing? : Total 6 Click Score: 6    End of Session   Activity Tolerance: Patient tolerated treatment well;Treatment limited secondary to agitation Patient left: in bed;with call bell/phone within reach;with bed alarm set;with family/visitor present;with SCD's reapplied Nurse Communication: Mobility status PT Visit Diagnosis: Other abnormalities of gait and mobility (R26.89);Other symptoms and signs involving the nervous system (R29.898)    Time: 0539-7673 PT Time Calculation (min) (ACUTE ONLY): 23 min   Charges:   PT Evaluation $PT Eval Moderate Complexity: 1 Mod          03/26/2018  Donnella Sham, PT Acute Rehabilitation Services [phone]  (pager) [phone]  (office)  Tessie Fass Lily Velasquez 03/26/2018, 12:45 PM

## 2018-03-26 NOTE — Progress Notes (Signed)
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 Pt not following commands during evaluation and required Max A + 2 with all mobility and total A for ADL. If pt begins to clear cognitively, feel she will be appropriate for CIR. If progression is slow, may need rehab at [facility]. Will follow acutely to address established goals and facilitate Dc to next venue of care.     03/26/18 1253  OT Visit Information  Last OT Received On 03/26/18  Assistance Needed +2  PT/OT/SLP Co-Evaluation/Treatment Yes  Reason for Co-Treatment Complexity of the patient's impairments (multi-system involvement);Necessary to address cognition/behavior during functional activity;For patient/therapist safety  OT goals addressed during session ADL's and self-care  History of Present Illness 42 y.o. female known cystic sellar/suprasellar mass mass. Underwent Stereotactic right pterional craniotomy for resection of suprasellar tumor 10/4.   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   Independent with ADL's/ iADL's  Communication  Communication Expressive difficulties;Receptive difficulties  Pain Assessment  Pain Assessment Faces  Faces Pain Scale 4  Pain Location difficult to  determine, likely head and neck due to position causing grimace  Pain Descriptors / Indicators Grimacing;Moaning (crying out ouch)  Pain Intervention(s) Limited activity within patient's tolerance  Cognition  Arousal/Alertness Lethargic  Behavior During Therapy Restless;Agitated;Impulsive  Overall Cognitive Status Impaired/Different from baseline  Area of Impairment Orientation;Attention;Following commands;Awareness;Problem solving;Safety/judgement  Current Attention Level Focused  Following Commands  (no command follow)  Safety/Judgement Decreased awareness of deficits;Decreased awareness of safety  Awareness Intellectual  Problem Solving Slow processing;Decreased initiation;Difficulty sequencing;Requires verbal cues;Requires tactile cues (unable)  Upper Extremity Assessment  Upper Extremity Assessment Generalized weakness (moving BUE spontaneously but not purposefully)  Lower Extremity Assessment  Lower Extremity Assessment Defer to PT evaluation  Cervical / Trunk Assessment  Cervical / Trunk Assessment Other exceptions (head biased into right rotation and slightly left lat. tilt.)  Cervical / Trunk Exceptions pushing toward L  ADL  Overall ADL's  Needs assistance/impaired  General ADL Comments total A with all ADL; biting straw  Vision- Assessment  Additional Comments R eye swollen shut; L eye closed throughout session  Bed Mobility  Overal bed mobility Needs Assistance  Bed Mobility Rolling;Sidelying to Sit  Rolling +2 for physical assistance;Total assist  Sidelying to sit +2 for physical assistance;Total assist  General bed mobility comments significant truncal assist, tactile cues to direct pt through normal sequencing.  hand over hand w/bearing and facilitation of push up with R UE  Transfers  Overall transfer level Needs assistance  Transfers Sit to/from Stand  Sit to Stand Max assist;+2 physical assistance  General transfer comment pt heavily biased to the right into  active flexion, no purposeful attempt to w/bear on the L LE  Balance  Overall balance assessment Needs assistance  Sitting-balance support Feet supported;Single extremity supported;Bilateral upper extremity  supported  Sitting balance-Leahy Scale Zero  Sitting balance - Comments heavy flexed lean to the right.  Increased pushing left with R UE when pt is brought into midline.  Pt's restlessness/agitation increased when pt help in midline.  Postural control Left lateral lean  Standing balance support During functional activity;Bilateral upper extremity supported  Standing balance-Leahy Scale Zero  Standing balance comment heavy lean L with no purposeful w/bearing on the L LE  OT - End of Session  Equipment Utilized During Treatment Gait belt  Activity Tolerance Treatment limited secondary to agitation;Patient limited by lethargy  Patient left in bed;with call bell/phone within reach;with bed alarm set;with family/visitor present;with SCD's reapplied  Nurse Communication Mobility status  OT Assessment  OT Recommendation/Assessment Patient needs continued OT Services  OT Visit Diagnosis Other abnormalities of gait and mobility (R26.89);Muscle weakness (generalized) (M62.81);Other symptoms and signs involving cognitive function;Pain  Pain - part of body  (head?)  OT Problem List Decreased strength;Decreased range of motion;Decreased activity tolerance;Impaired balance (sitting and/or standing);Impaired vision/perception;Decreased coordination;Decreased cognition;Decreased safety awareness;Decreased knowledge of use of DME or AE;Cardiopulmonary status limiting activity;Impaired UE functional use;Pain  OT Plan  OT Frequency (ACUTE ONLY) Min 2X/week  OT Treatment/Interventions (ACUTE ONLY) Self-care/ADL training;Therapeutic exercise;Neuromuscular education;DME and/or AE instruction;Therapeutic activities;Cognitive remediation/compensation;Visual/perceptual remediation/compensation;Patient/family  education;Balance training  AM-PAC OT "6 Clicks" Daily Activity Outcome Measure  Help from another person eating meals? 1  Help from another person taking care of personal grooming? 1  Help from another person toileting, which includes using toliet, bedpan, or urinal? 1  Help from another person bathing (including washing, rinsing, drying)? 1  Help from another person to put on and taking off regular upper body clothing? 1  Help from another person to put on and taking off regular lower body clothing? 1  6 Click Score 6  ADL G Code Conversion CN  OT Recommendation  Recommendations for Other Services Rehab consult  Follow Up Recommendations Supervision/Assistance - 24 hour;CIR (pending progress)  OT Equipment 3 in 1 bedside commode  Individuals Consulted  Consulted and Agree with Results and Recommendations Patient unable/family or caregiver not available  Acute Rehab OT Goals  Patient Stated Goal pt unable  OT Goal Formulation Patient unable to participate in goal setting  Time For Goal Achievement 04/09/18  Potential to Achieve Goals Good  OT Time Calculation  OT Start Time (ACUTE ONLY) 1151  OT Stop Time (ACUTE ONLY) 1216  OT Time Calculation (min) 25 min  OT General Charges  $OT Visit 1 Visit  OT Evaluation  $OT Eval Moderate Complexity 1 Mod  Written Expression  Dominant Hand Right  Maurie Boettcher, OT/L   Acute OT Clinical Specialist Acute Rehabilitation Services Pager [phone] Office [phone]

## 2018-03-26 NOTE — Progress Notes (Signed)
Rehab Admissions Coordinator Note:  Patient was screened by Cleatrice Burke for appropriateness for an Inpatient Acute Rehab Consult per PT and OT recommendations.Noted cognitive deficits.  At this time, we are recommending Inpatient Rehab consult. Please place order for consult.  Cleatrice Burke 03/26/2018, 2:06 PM  I can be reached at [phone].

## 2018-03-26 NOTE — Progress Notes (Signed)
  NEUROSURGERY PROGRESS NOTE   No issues overnight.   EXAM:  BP (!) 132/92   Pulse 67   Temp (!) 101.2 F (38.4 C) (Axillary)   Resp (!) 28   Ht 5\' 7"  (1.702 m)   Wt 82.2 kg   SpO2 99%   BMI 28.38 kg/m   Awake, alert, occasionally will answer simple questions Perseverates, largely confused CN grossly intact  Moves all extremities well  IMPRESSION:  - 42 y.o. 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

## 2018-03-27 ENCOUNTER — Inpatient Hospital Stay (HOSPITAL_COMMUNITY): Payer: BLUE CROSS/BLUE SHIELD

## 2018-03-27 LAB — BASIC METABOLIC PANEL
ANION GAP: 13 (ref 5–15)
ANION GAP: 10 (ref 5–15)
ANION GAP: 10 (ref 5–15)
Anion gap: 11 (ref 5–15)
Anion gap: 9 (ref 5–15)
Anion gap: 10 (ref 5–15)
BUN: <5 mg/dL — ABNORMAL LOW (ref 6–20)
BUN: 5 mg/dL — ABNORMAL LOW (ref 6–20)
BUN: <5 mg/dL — ABNORMAL LOW (ref 6–20)
BUN: 5 mg/dL — AB (ref 6–20)
CALCIUM: 9 mg/dL (ref 8.9–10.3)
CALCIUM: 9 mg/dL (ref 8.9–10.3)
CALCIUM: 9.2 mg/dL (ref 8.9–10.3)
CALCIUM: 9.1 mg/dL (ref 8.9–10.3)
CALCIUM: 9.5 mg/dL (ref 8.9–10.3)
CO2: 20 mmol/L — ABNORMAL LOW (ref 22–32)
CO2: 19 mmol/L — ABNORMAL LOW (ref 22–32)
CO2: 22 mmol/L (ref 22–32)
CO2: 22 mmol/L (ref 22–32)
CO2: 21 mmol/L — ABNORMAL LOW (ref 22–32)
CO2: 19 mmol/L — ABNORMAL LOW (ref 22–32)
CREATININE: 0.89 mg/dL (ref 0.44–1.00)
CREATININE: 0.9 mg/dL (ref 0.44–1.00)
CREATININE: 0.84 mg/dL (ref 0.44–1.00)
Calcium: 9.9 mg/dL (ref 8.9–10.3)
Chloride: 95 mmol/L — ABNORMAL LOW (ref 98–111)
Chloride: 93 mmol/L — ABNORMAL LOW (ref 98–111)
Chloride: 95 mmol/L — ABNORMAL LOW (ref 98–111)
Chloride: 95 mmol/L — ABNORMAL LOW (ref 98–111)
Chloride: 100 mmol/L (ref 98–111)
Chloride: 107 mmol/L (ref 98–111)
Creatinine, Ser: 0.82 mg/dL (ref 0.44–1.00)
Creatinine, Ser: 0.83 mg/dL (ref 0.44–1.00)
Creatinine, Ser: 0.92 mg/dL (ref 0.44–1.00)
GFR calc Af Amer: >60 mL/min (ref >60)
GFR calc Af Amer: >60 mL/min (ref >60)
GFR calc Af Amer: >60 mL/min (ref >60)
GFR calc Af Amer: >60 mL/min (ref >60)
GFR calc non Af Amer: >60 mL/min (ref >60)
GLUCOSE: 107 mg/dL — AB (ref 70–99)
GLUCOSE: 115 mg/dL — AB (ref 70–99)
GLUCOSE: 134 mg/dL — AB (ref 70–99)
GLUCOSE: 119 mg/dL — AB (ref 70–99)
Glucose, Bld: 106 mg/dL — ABNORMAL HIGH (ref 70–99)
Glucose, Bld: 110 mg/dL — ABNORMAL HIGH (ref 70–99)
POTASSIUM: 2.8 mmol/L — AB (ref 3.5–5.1)
POTASSIUM: 2.6 mmol/L — AB (ref 3.5–5.1)
POTASSIUM: 3.1 mmol/L — AB (ref 3.5–5.1)
POTASSIUM: 3.4 mmol/L — AB (ref 3.5–5.1)
POTASSIUM: 4.6 mmol/L (ref 3.5–5.1)
Potassium: 3.1 mmol/L — ABNORMAL LOW (ref 3.5–5.1)
SODIUM: 126 mmol/L — AB (ref 135–145)
SODIUM: 125 mmol/L — AB (ref 135–145)
SODIUM: 127 mmol/L — AB (ref 135–145)
SODIUM: 127 mmol/L — AB (ref 135–145)
SODIUM: 130 mmol/L — AB (ref 135–145)
SODIUM: 136 mmol/L (ref 135–145)

## 2018-03-27 MED ORDER — ORAL CARE MOUTH RINSE
15.0000 mL | Freq: Two times a day (BID) | OROMUCOSAL | Status: DC
  Administered 2018-03-27 – 2018-03-28 (×2): 15 mL via OROMUCOSAL

## 2018-03-27 MED ORDER — SODIUM CHLORIDE 0.9 % IV SOLN
0.5000 ug | Freq: Two times a day (BID) | INTRAVENOUS | Status: DC
  Administered 2018-03-27 – 2018-03-29 (×5): 0.52 ug via INTRAVENOUS
  Filled 2018-03-27 (×6): qty 0.13

## 2018-03-27 MED ORDER — HYDROCORTISONE NA SUCCINATE PF 100 MG IJ SOLR
100.0000 mg | Freq: Two times a day (BID) | INTRAMUSCULAR | Status: DC
  Administered 2018-03-27 – 2018-03-29 (×5): 100 mg via INTRAVENOUS
  Filled 2018-03-27 (×6): qty 2

## 2018-03-27 MED ORDER — LEVOTHYROXINE SODIUM 100 MCG IV SOLR
25.0000 ug | Freq: Every day | INTRAVENOUS | Status: DC
  Administered 2018-03-27 – 2018-03-28 (×2): 25 ug via INTRAVENOUS
  Filled 2018-03-27 (×2): qty 5

## 2018-03-27 MED ORDER — BISACODYL 10 MG RE SUPP: 5.0000 mg | Freq: Every day | RECTAL | Status: DC | PRN

## 2018-03-27 MED ORDER — BISACODYL 5 MG PO TBEC
5.0000 mg | DELAYED_RELEASE_TABLET | Freq: Every day | ORAL | Status: DC | PRN
  Administered 2018-03-28: 5 mg via ORAL
  Filled 2018-03-27: qty 1

## 2018-03-27 MED ORDER — POTASSIUM CHLORIDE 10 MEQ/100ML IV SOLN
10.0000 meq | INTRAVENOUS | Status: AC
  Administered 2018-03-27 (×8): 10 meq via INTRAVENOUS
  Filled 2018-03-27 (×8): qty 100

## 2018-03-27 MED ORDER — CHLORHEXIDINE GLUCONATE 0.12 % MT SOLN
15.0000 mL | Freq: Two times a day (BID) | OROMUCOSAL | Status: DC
  Administered 2018-03-27 – 2018-03-31 (×9): 15 mL via OROMUCOSAL
  Filled 2018-03-27 (×5): qty 15

## 2018-03-27 NOTE — Progress Notes (Signed)
PT Cancellation Note  Patient Details Name: Cindy Lamb [facility] MRN: [id number] DOB: January 02, 1976   Cancelled Treatment:    Reason Eval/Treat Not Completed: Patient not medically ready.  Pt with decline over eval, will hold for today per nursing. 03/27/2018  Donnella Sham, PT Acute Rehabilitation Services [phone]  (pager) [phone]  (office)   Tessie Fass Jameila Keeny 03/27/2018, 4:25 PM

## 2018-03-27 NOTE — Progress Notes (Signed)
Called Dr Kathyrn Sheriff about low specific gravity of 1.0002 and increased urine output of over 4 L this shift. New orders for DDAVP .73mcg IVP were given to start now and then on 1000/2200 schedule for tomorrow. Will continue to assess. Cindy Lamb C 5:08 PM

## 2018-03-27 NOTE — Progress Notes (Signed)
Spoke with Dr Kathyrn Sheriff regarding CT results.  He said we will continue to assess and, once Na levels are normalized, we will decide on need for MRI. I also asked about Cortrak insertion and Dr Kathyrn Sheriff said we could not insert one. Arieonna Medine C 3:00 PM

## 2018-03-27 NOTE — Progress Notes (Signed)
Per day shift RN, Dr. Kathyrn Sheriff wanted to be notified of Na level increasing more than 3 points within a 4 hour span. The 2000 Na level was 136 from 130. On-call neurosurgery NP notified. No new orders at this time, will continue to monitor.

## 2018-03-27 NOTE — Progress Notes (Signed)
  NEUROSURGERY PROGRESS NOTE   No issues overnight. Remains very lethargic.  EXAM:  BP 104/74   Pulse 83   Temp (!) 101.2 F (38.4 C) (Axillary)   Resp (!) 21   Ht 5\' 7"  (1.702 m)   Wt 82.2 kg   SpO2 95%   BMI 28.38 kg/m   Opens eyes with stimulation Not answering questions Moves purposefully but not really to command  IMPRESSION:  42 y.o. female POD# 4 s/p resection of suprasellar mass. - Postop DI now hyponatremic - Has not gotten glucocorticoid since 10/5  PLAN: - Will start IV hydrocortisone 100mg  BID  - Will restart synthroid IV - Check head CT - d/c ddAVP for now, cont to monitor q4hr Na

## 2018-03-28 LAB — BASIC METABOLIC PANEL
ANION GAP: 10 (ref 5–15)
ANION GAP: 12 (ref 5–15)
ANION GAP: 9 (ref 5–15)
Anion gap: 8 (ref 5–15)
BUN: 5 mg/dL — ABNORMAL LOW (ref 6–20)
BUN: 6 mg/dL (ref 6–20)
BUN: 5 mg/dL — ABNORMAL LOW (ref 6–20)
BUN: 6 mg/dL (ref 6–20)
CALCIUM: 9.6 mg/dL (ref 8.9–10.3)
CALCIUM: 9.4 mg/dL (ref 8.9–10.3)
CALCIUM: 9.3 mg/dL (ref 8.9–10.3)
CHLORIDE: 107 mmol/L (ref 98–111)
CO2: 19 mmol/L — AB (ref 22–32)
CO2: 20 mmol/L — ABNORMAL LOW (ref 22–32)
CO2: 21 mmol/L — ABNORMAL LOW (ref 22–32)
CO2: 22 mmol/L (ref 22–32)
Calcium: 9.4 mg/dL (ref 8.9–10.3)
Chloride: 106 mmol/L (ref 98–111)
Chloride: 108 mmol/L (ref 98–111)
Chloride: 109 mmol/L (ref 98–111)
Creatinine, Ser: 0.82 mg/dL (ref 0.44–1.00)
Creatinine, Ser: 0.88 mg/dL (ref 0.44–1.00)
Creatinine, Ser: 0.84 mg/dL (ref 0.44–1.00)
Creatinine, Ser: 0.83 mg/dL (ref 0.44–1.00)
GFR calc Af Amer: >60 mL/min (ref >60)
GLUCOSE: 130 mg/dL — AB (ref 70–99)
Glucose, Bld: 113 mg/dL — ABNORMAL HIGH (ref 70–99)
Glucose, Bld: 115 mg/dL — ABNORMAL HIGH (ref 70–99)
Glucose, Bld: 129 mg/dL — ABNORMAL HIGH (ref 70–99)
POTASSIUM: 3.5 mmol/L (ref 3.5–5.1)
POTASSIUM: 3.7 mmol/L (ref 3.5–5.1)
POTASSIUM: 4.1 mmol/L (ref 3.5–5.1)
Potassium: 3.4 mmol/L — ABNORMAL LOW (ref 3.5–5.1)
SODIUM: 140 mmol/L (ref 135–145)
Sodium: 137 mmol/L (ref 135–145)
Sodium: 136 mmol/L (ref 135–145)
Sodium: 138 mmol/L (ref 135–145)

## 2018-03-28 MED ORDER — LIP MEDEX EX OINT
TOPICAL_OINTMENT | CUTANEOUS | Status: DC | PRN
  Administered 2018-03-28: 1 via TOPICAL
  Filled 2018-03-28: qty 7

## 2018-03-28 MED ORDER — VALACYCLOVIR HCL 500 MG PO TABS
1000.0000 mg | ORAL_TABLET | Freq: Every day | ORAL | Status: DC
  Administered 2018-03-28 – 2018-03-31 (×4): 1000 mg via ORAL
  Filled 2018-03-28 (×4): qty 2

## 2018-03-28 MED ORDER — LEVOTHYROXINE SODIUM 50 MCG PO TABS
50.0000 ug | ORAL_TABLET | Freq: Every day | ORAL | Status: DC
  Administered 2018-03-29 – 2018-03-31 (×3): 50 ug via ORAL
  Filled 2018-03-28 (×3): qty 1

## 2018-03-28 NOTE — Progress Notes (Signed)
Inpatient Rehabilitation Admissions Coordinator  Pt continues to improve. Please place order for rehab consult if pt/family would like pt to be considered for admit. Please advise.  Danne Baxter, RN, MSN Rehab Admissions Coordinator [phone] 03/28/2018 9:16 PM

## 2018-03-28 NOTE — Progress Notes (Signed)
Occupational Therapy Treatment Patient Details Name: Cindy Lamb [facility] MRN: [id number] DOB: 1975/09/18 Today's Date: 03/28/2018    History of present illness 42 y.o. female known cystic sellar/suprasellar mass mass. Underwent Stereotactic right pterional craniotomy for resection of suprasellar tumor 10/4.    OT comments  Pt demonstrates significant improvement.  She is now able to complete ADLs with min A, but requires min A +2 for functional mobility due to imbalance, and poor attention and safety awareness.   She is noted to possibly have intermittent visual hallucinations - insistent something jumped in the trash can and that her ex boyfriend spent the night under her bed.  Recommend CIR as she needs the consistency and expertise of a treatment team who specializes in neuro rehab.  She has an underage son to whom she is the custodial parent, and needs to be able to regain skill set needed to resume this role.  Will follow acutely.   Follow up reocmmendations  CIR;Supervision/Assistance - 24 hour    Equipment Recommendations  3 in 1 bedside commode    Recommendations for Other Services Rehab consult    Precautions / Restrictions Precautions Precautions: Fall       Mobility Bed Mobility Overal bed mobility: Needs Assistance Bed Mobility: Sit to Supine     Supine to sit: Min assist;+2 for physical assistance     General bed mobility comments: significant lift of LE's and truncal assist  Transfers Overall transfer level: Needs assistance   Transfers: Sit to/from Stand Sit to Stand: Min assist         General transfer comment: stability assist    Balance Overall balance assessment: Needs assistance   Sitting balance-Leahy Scale: Fair     Standing balance support: During functional activity;Single extremity supported Standing balance-Leahy Scale: Poor(to poor) Standing balance comment: requires min A                            ADL either performed or  assessed with clinical judgement   ADL Overall ADL's : Needs assistance/impaired Eating/Feeding: Set up;Sitting   Grooming: Wash/dry hands;Wash/dry face;Oral care;Minimal assistance;Standing Grooming Details (indicate cue type and reason): verbal cues for sequencing and attention  Upper Body Bathing: Minimal assistance;Sitting   Lower Body Bathing: Minimal assistance;Sit to/from stand   Upper Body Dressing : Minimal assistance;Sitting   Lower Body Dressing: Minimal assistance;Sit to/from stand   Toilet Transfer: Minimal assistance;+2 for physical assistance;+2 for safety/equipment;Ambulation;Comfort height toilet   Toileting- Clothing Manipulation and Hygiene: Minimal assistance;Sit to/from stand       Functional mobility during ADLs: Minimal assistance;+2 for physical assistance;+2 for safety/equipment       Vision       Perception     Praxis      Cognition Arousal/Alertness: Awake/alert Behavior During Therapy: Impulsive Overall Cognitive Status: Impaired/Different from baseline Area of Impairment: Attention                   Current Attention Level: Selective;Sustained   Following Commands: Follows one step commands consistently Safety/Judgement: Decreased awareness of safety;Decreased awareness of deficits Awareness: Emergent;Intellectual Problem Solving: Difficulty sequencing;Requires verbal cues General Comments: Pt self distracts frequently.  She requires cues for problem solving and sequencing. She was noted to be delusional and appeared to hallucinate at times.  States something jumped into the trash can, and reports that ex boyfriend Germain Osgood spent the night under her bed         Exercises  Shoulder Instructions       General Comments VSS     Pertinent Vitals/ Pain       Pain Assessment: Faces Faces Pain Scale: Hurts a little bit Pain Descriptors / Indicators: Discomfort Pain Intervention(s): Monitored during session  Home Living                                           Prior Functioning/Environment              Frequency  Min 2X/week        Progress Toward Goals  OT Goals(current goals can now be found in the care plan section)  Progress towards OT goals: Progressing toward goals  Acute Rehab OT Goals Patient Stated Goal: got to get back going ADL Goals Pt Will Perform Grooming: with supervision;standing Pt Will Perform Upper Body Bathing: with supervision;standing Pt Will Perform Lower Body Bathing: with supervision;sit to/from stand Pt Will Perform Upper Body Dressing: with supervision;sitting Pt Will Perform Lower Body Dressing: with supervision;sit to/from stand Pt Will Transfer to Toilet: with supervision;ambulating;regular height toilet;grab bars Pt Will Perform Toileting - Clothing Manipulation and hygiene: with supervision;sit to/from stand Additional ADL Goal #1: Pt will selectively attend to ADL tasks with no verbal cues  Plan Discharge plan remains appropriate    Co-evaluation    PT/OT/SLP Co-Evaluation/Treatment: Yes Reason for Co-Treatment: For patient/therapist safety;Necessary to address cognition/behavior during functional activity;To address functional/ADL transfers   OT goals addressed during session: ADL's and self-care      AM-PAC PT "6 Clicks" Daily Activity     Outcome Measure   Help from another person eating meals?: A Little Help from another person taking care of personal grooming?: A Little Help from another person toileting, which includes using toliet, bedpan, or urinal?: A Little Help from another person bathing (including washing, rinsing, drying)?: A Little Help from another person to put on and taking off regular upper body clothing?: A Little Help from another person to put on and taking off regular lower body clothing?: A Little 6 Click Score: 18    End of Session    OT Visit Diagnosis: Unsteadiness on feet (R26.81);Cognitive  communication deficit (R41.841)   Activity Tolerance Patient tolerated treatment well   Patient Left in bed;with call bell/phone within reach;with bed alarm set   Nurse Communication Mobility status        Time: 1450-1530 OT Time Calculation (min): 40 min  Charges: OT General Charges $OT Visit: 1 Visit OT Treatments $Self Care/Home Management : 8-22 mins  Lucille Passy, OTR/L [place] Pager [phone] Office [phone]    Lucille Passy M 03/28/2018, 6:31 PM

## 2018-03-28 NOTE — Progress Notes (Signed)
  NEUROSURGERY PROGRESS NOTE   No issues overnight.   EXAM:  BP (!) 110/91   Pulse 77   Temp 99 F (37.2 C) (Oral)   Resp 18   Ht 5\' 7"  (1.702 m)   Wt 82.2 kg   SpO2 100%   BMI 28.38 kg/m   Awake, alert, oriented x3 Speech fluent, appropriate  CN grossly intact  5/5 BUE/BLE   PATHOLOGY: Suprasellar cyst wall c/w epidermoid cyst  IMPRESSION:  42 y.o. female POD# 7 s/p right pterional crani for epidermoid. Significantly improved with normalization of serum Na and addition of steroids.  PLAN: - Cont current dose of ddAVP, as she awakens will d/c IVF and allow free access to water - Cont hydrocortisone, will decrease dose to more physiologic level tomorrow - PT/OT

## 2018-03-28 NOTE — Progress Notes (Signed)
Physical Therapy Treatment Patient Details Name: Cindy Lamb [facility] MRN: [id number] DOB: 1975-10-08 Today's Date: 03/28/2018    History of Present Illness 42 y.o. female known cystic sellar/suprasellar mass mass. Underwent Stereotactic right pterional craniotomy for resection of suprasellar tumor 10/4.     PT Comments    Much improved, from obtunded 10/9 to animated, impulsive and ambulatory.     Follow Up Recommendations  CIR;Other (comment)     Equipment Recommendations  Other (comment)    Recommendations for Other Services       Precautions / Restrictions Precautions Precautions: Fall    Mobility  Bed Mobility Overal bed mobility: Needs Assistance Bed Mobility: Sit to Supine     Supine to sit: Min assist;+2 for physical assistance     General bed mobility comments: significant lift of LE's and truncal assist  Transfers Overall transfer level: Needs assistance   Transfers: Sit to/from Stand Sit to Stand: Min assist         General transfer comment: stability assist  Ambulation/Gait Ambulation/Gait assistance: Min assist;+2 physical assistance(mod assist as she fatigued and degraded.) Gait Distance (Feet): 140 Feet   Gait Pattern/deviations: Step-through pattern Gait velocity: variable with ability to increase speed, but without the necessary control at time. Gait velocity interpretation: 1.31 - 2.62 ft/sec, indicative of limited community ambulator General Gait Details: Overall unsteady with left lean and drift.  Pt very distractable and not concerned with line and catheter.  Needing more assist with as pt turned around without notice having lost focus in the moment   Stairs             Wheelchair Mobility    Modified Rankin (Stroke Patients Only)       Balance Overall balance assessment: Needs assistance   Sitting balance-Leahy Scale: Fair     Standing balance support: During functional activity;Single extremity supported Standing  balance-Leahy Scale: Fair(to poor)                              Cognition Arousal/Alertness: Awake/alert Behavior During Therapy: Impulsive Overall Cognitive Status: Impaired/Different from baseline Area of Impairment: Attention                   Current Attention Level: Selective   Following Commands: Follows one step commands consistently Safety/Judgement: Decreased awareness of safety;Decreased awareness of deficits Awareness: Emergent Problem Solving: Difficulty sequencing General Comments: low focus, impulsive, tangential, disinhibited      Exercises      General Comments        Pertinent Vitals/Pain Pain Assessment: Faces Faces Pain Scale: Hurts a little bit Pain Descriptors / Indicators: Discomfort Pain Intervention(s): Monitored during session    Home Living                      Prior Function            PT Goals (current goals can now be found in the care plan section) Acute Rehab PT Goals Patient Stated Goal: got to get back going PT Goal Formulation: With patient Time For Goal Achievement: 04/09/18 Potential to Achieve Goals: Fair Progress towards PT goals: Progressing toward goals    Frequency    Min 4X/week      PT Plan Current plan remains appropriate    Co-evaluation              AM-PAC PT "6 Clicks" Daily Activity  Outcome Measure  Difficulty turning  over in bed (including adjusting bedclothes, sheets and blankets)?: A Little Difficulty moving from lying on back to sitting on the side of the bed? : Unable Difficulty sitting down on and standing up from a chair with arms (e.g., wheelchair, bedside commode, etc,.)?: Unable Help needed moving to and from a bed to chair (including a wheelchair)?: A Little Help needed walking in hospital room?: A Little Help needed climbing 3-5 steps with a railing? : A Lot 6 Click Score: 13    End of Session   Activity Tolerance: Patient tolerated treatment  well Patient left: in bed;with call bell/phone within reach;with bed alarm set;with SCD's reapplied Nurse Communication: Mobility status PT Visit Diagnosis: Other abnormalities of gait and mobility (R26.89);Other symptoms and signs involving the nervous system (R29.898)     Time: 1450-1530 PT Time Calculation (min) (ACUTE ONLY): 40 min  Charges:  $Gait Training: 8-22 mins $Therapeutic Activity: 8-22 mins                     03/28/2018  Donnella Sham, [place] [phone]  (pager) [phone]  (office)   Cindy Lamb 03/28/2018, 5:12 PM

## 2018-03-29 LAB — BASIC METABOLIC PANEL
ANION GAP: 10 (ref 5–15)
ANION GAP: 12 (ref 5–15)
ANION GAP: 8 (ref 5–15)
ANION GAP: 8 (ref 5–15)
ANION GAP: 8 (ref 5–15)
BUN: 7 mg/dL (ref 6–20)
BUN: 7 mg/dL (ref 6–20)
BUN: 5 mg/dL — ABNORMAL LOW (ref 6–20)
BUN: 10 mg/dL (ref 6–20)
BUN: 7 mg/dL (ref 6–20)
CALCIUM: 9.3 mg/dL (ref 8.9–10.3)
CALCIUM: 9.4 mg/dL (ref 8.9–10.3)
CALCIUM: 9.5 mg/dL (ref 8.9–10.3)
CALCIUM: 9 mg/dL (ref 8.9–10.3)
CHLORIDE: 101 mmol/L (ref 98–111)
CO2: 20 mmol/L — AB (ref 22–32)
CO2: 23 mmol/L (ref 22–32)
CO2: 23 mmol/L (ref 22–32)
CO2: 23 mmol/L (ref 22–32)
CO2: 25 mmol/L (ref 22–32)
Calcium: 9.4 mg/dL (ref 8.9–10.3)
Chloride: 104 mmol/L (ref 98–111)
Chloride: 101 mmol/L (ref 98–111)
Chloride: 107 mmol/L (ref 98–111)
Chloride: 107 mmol/L (ref 98–111)
Creatinine, Ser: 0.83 mg/dL (ref 0.44–1.00)
Creatinine, Ser: 0.79 mg/dL (ref 0.44–1.00)
Creatinine, Ser: 0.78 mg/dL (ref 0.44–1.00)
Creatinine, Ser: 0.8 mg/dL (ref 0.44–1.00)
Creatinine, Ser: 0.77 mg/dL (ref 0.44–1.00)
GFR calc Af Amer: >60 mL/min (ref >60)
GFR calc Af Amer: >60 mL/min (ref >60)
GFR calc Af Amer: >60 mL/min (ref >60)
GFR calc Af Amer: >60 mL/min (ref >60)
GFR calc Af Amer: >60 mL/min (ref >60)
GFR calc non Af Amer: >60 mL/min (ref >60)
GFR calc non Af Amer: >60 mL/min (ref >60)
GFR calc non Af Amer: >60 mL/min (ref >60)
GFR calc non Af Amer: >60 mL/min (ref >60)
GLUCOSE: 110 mg/dL — AB (ref 70–99)
GLUCOSE: 114 mg/dL — AB (ref 70–99)
GLUCOSE: 122 mg/dL — AB (ref 70–99)
GLUCOSE: 123 mg/dL — AB (ref 70–99)
GLUCOSE: 124 mg/dL — AB (ref 70–99)
POTASSIUM: 3.3 mmol/L — AB (ref 3.5–5.1)
POTASSIUM: 4 mmol/L (ref 3.5–5.1)
POTASSIUM: 3.2 mmol/L — AB (ref 3.5–5.1)
Potassium: 3.4 mmol/L — ABNORMAL LOW (ref 3.5–5.1)
Potassium: 3.6 mmol/L (ref 3.5–5.1)
Sodium: 135 mmol/L (ref 135–145)
Sodium: 136 mmol/L (ref 135–145)
Sodium: 140 mmol/L (ref 135–145)
Sodium: 137 mmol/L (ref 135–145)
Sodium: 132 mmol/L — ABNORMAL LOW (ref 135–145)

## 2018-03-29 MED ORDER — HYDROCORTISONE 20 MG PO TABS
20.0000 mg | ORAL_TABLET | Freq: Every morning | ORAL | Status: DC
  Administered 2018-03-30 – 2018-03-31 (×2): 20 mg via ORAL
  Filled 2018-03-29 (×2): qty 1

## 2018-03-29 MED ORDER — HYDROCORTISONE 10 MG PO TABS
10.0000 mg | ORAL_TABLET | Freq: Every evening | ORAL | Status: DC
  Administered 2018-03-29 – 2018-03-30 (×2): 10 mg via ORAL
  Filled 2018-03-29 (×3): qty 1

## 2018-03-29 MED ORDER — BOOST / RESOURCE BREEZE PO LIQD CUSTOM
1.0000 | Freq: Three times a day (TID) | ORAL | Status: DC
  Administered 2018-03-29 – 2018-03-30 (×4): 1 via ORAL

## 2018-03-29 NOTE — Progress Notes (Signed)
Nutrition Follow-up  DOCUMENTATION CODES:   Not applicable  INTERVENTION:   Boost Breeze po TID, each supplement provides 250 kcal and 9 grams of protein  NUTRITION DIAGNOSIS:   Inadequate oral intake related to lethargy/confusion as evidenced by meal completion < 25%. Progressing.   GOAL:   Patient will meet greater than or equal to 90% of their needs Progressing.   MONITOR:   PO intake, Supplement acceptance, Diet advancement, Labs, Weight trends, I & O's  ASSESSMENT:   Patient with PMH significant for anxiety, IBS, GERD, benign tumor of pituitary gland, and cervicogenic headache. In 03/2018, she underwent transphenoidal approach for resection but due to significant venous bleeding, surgery was cancelled. Presents this admission for surgical resection via craniotomy.   Pt discussed during ICU rounds and with RN.  Pt much more alert today per RN. Spoke with pt at bedside. She is feeling better. Ate 50% of her dinner last night but only a few bites this am. She does not like the ensure but is willing to try the boost breeze. 10/6 s/p crani for resection of suprasellar mass.   Medications reviewed and include: colace, solu-cortef, synthroid D5 1/2NS @ 125 ml/hr Labs reviewed: K+ 3.3 (L)   Diet Order:   Diet Order            Diet regular Room service appropriate? Yes; Fluid consistency: Thin  Diet effective 1000              EDUCATION NEEDS:   Not appropriate for education at this time  Skin:  Skin Assessment: Skin Integrity Issues: Skin Integrity Issues:: Incisions Incisions: closed- right head  Last BM:  10/11 medium  Height:   Ht Readings from Last 1 Encounters:  03/22/18 5\' 7"  (1.702 m)    Weight:   Wt Readings from Last 1 Encounters:  03/22/18 82.2 kg    Ideal Body Weight:  61.4 kg  BMI:  Body mass index is 28.38 kg/m.  Estimated Nutritional Needs:   Kcal:  1650-1850 kcal  Protein:  85-100 grams  Fluid:  >/= 1.6 L/day  Maylon Peppers  RD, LDN, CNSC [phone] Pager [phone] After Hours Pager

## 2018-03-29 NOTE — Progress Notes (Signed)
Physical Therapy Treatment Patient Details Name: Cindy Lamb [facility] MRN: [id number] DOB: 09-06-1975 Today's Date: 03/29/2018    History of Present Illness 42 y.o. female known cystic sellar/suprasellar mass mass. Underwent Stereotactic right pterional craniotomy for resection of suprasellar tumor 10/4.     PT Comments    Pt much improved.  Mobilizing at min guard to supervision level.  She is a little distractible, but can be redirected well.  Emphasis on balance tasks and gait stability.    Follow Up Recommendations  Home health PT;Other (comment)(with progression to OPPT)     Equipment Recommendations  Other (comment)    Recommendations for Other Services       Precautions / Restrictions Precautions Precautions: Fall    Mobility  Bed Mobility Overal bed mobility: Needs Assistance Bed Mobility: Supine to Sit     Supine to sit: Supervision     General bed mobility comments: no assist need from a flat bed.  Transfers Overall transfer level: Needs assistance   Transfers: Sit to/from Stand Sit to Stand: Min guard            Ambulation/Gait Ambulation/Gait assistance: Min guard;Supervision Gait Distance (Feet): 400 Feet Assistive device: None Gait Pattern/deviations: Step-through pattern   Gait velocity interpretation: >2.62 ft/sec, indicative of community ambulatory General Gait Details: Generally steady, able to increase speed significantly, scan her environment, turn abruptly without significant deviation   Stairs Stairs: Yes Stairs assistance: Min guard Stair Management: One rail Right;Alternating pattern;Forwards Number of Stairs: 5 General stair comments: safe with rails     Wheelchair Mobility    Modified Rankin (Stroke Patients Only)       Balance Overall balance assessment: Needs assistance Sitting-balance support: Feet supported;No upper extremity supported Sitting balance-Leahy Scale: Fair       Standing balance-Leahy Scale:  Fair Standing balance comment: much more stable with both static and dynamic standing.                            Cognition Arousal/Alertness: Awake/alert Behavior During Therapy: WFL for tasks assessed/performed(less impulsive, still disinhibited) Overall Cognitive Status: Within Functional Limits for tasks assessed(still suspect pt not quite at baseline mentation)                     Current Attention Level: Alternating   Following Commands: Follows one step commands inconsistently(multi step not tested)   Awareness: Emergent   General Comments: still mildly distractable.  Cognitive assessment might be helpful      Exercises      General Comments General comments (skin integrity, edema, etc.): vss      Pertinent Vitals/Pain Pain Assessment: No/denies pain    Home Living                      Prior Function            PT Goals (current goals can now be found in the care plan section) Acute Rehab PT Goals Patient Stated Goal: got to get back going PT Goal Formulation: With patient Time For Goal Achievement: 04/09/18 Potential to Achieve Goals: Fair Progress towards PT goals: Progressing toward goals    Frequency    Min 4X/week      PT Plan Current plan remains appropriate    Co-evaluation              AM-PAC PT "6 Clicks" Daily Activity  Outcome Measure  Difficulty turning over in  bed (including adjusting bedclothes, sheets and blankets)?: A Little Difficulty moving from lying on back to sitting on the side of the bed? : None Difficulty sitting down on and standing up from a chair with arms (e.g., wheelchair, bedside commode, etc,.)?: None Help needed moving to and from a bed to chair (including a wheelchair)?: A Little Help needed walking in hospital room?: A Little Help needed climbing 3-5 steps with a railing? : A Little 6 Click Score: 20    End of Session   Activity Tolerance: Patient tolerated treatment  well Patient left: in chair;with call bell/phone within reach;with family/visitor present Nurse Communication: Mobility status PT Visit Diagnosis: Other abnormalities of gait and mobility (R26.89);Other symptoms and signs involving the nervous system (R29.898)     Time: 7035-0093 PT Time Calculation (min) (ACUTE ONLY): 27 min  Charges:  $Gait Training: 8-22 mins $Therapeutic Activity: 8-22 mins                     03/29/2018  Donnella Sham, [place] [phone]  (pager) [phone]  (office)   Tessie Fass Danzel Marszalek 03/29/2018, 4:01 PM

## 2018-03-29 NOTE — Progress Notes (Signed)
  NEUROSURGERY PROGRESS NOTE   No issues overnight. Much more awake, interactive today. Minimal complaints.  EXAM:  BP 122/86   Pulse (!) 152   Temp 98.8 F (37.1 C) (Oral)   Resp (!) 37   Ht 5\' 7"  (1.702 m)   Wt 82.2 kg   SpO2 93%   BMI 28.38 kg/m   Awake, alert, oriented  Speech fluent, appropriate  CN grossly intact  5/5 BUE/BLE  Wound c/d/i  IMPRESSION:  42 y.o. female POD# 7 s/p crani for resection of suprasellar epidermoid cyst. At neurologic baseline. - DI appears well controlled on current dose of ddAVP.   PLAN: - Will change IV steroids to PO, at dosage of 20mg  Qam and 10mg  Qpm

## 2018-03-30 LAB — BASIC METABOLIC PANEL
ANION GAP: 9 (ref 5–15)
ANION GAP: 9 (ref 5–15)
ANION GAP: 10 (ref 5–15)
BUN: 7 mg/dL (ref 6–20)
BUN: 6 mg/dL (ref 6–20)
BUN: 6 mg/dL (ref 6–20)
CALCIUM: 9.3 mg/dL (ref 8.9–10.3)
CALCIUM: 9.2 mg/dL (ref 8.9–10.3)
CO2: 24 mmol/L (ref 22–32)
CO2: 24 mmol/L (ref 22–32)
CO2: 24 mmol/L (ref 22–32)
Calcium: 9.4 mg/dL (ref 8.9–10.3)
Chloride: 103 mmol/L (ref 98–111)
Chloride: 102 mmol/L (ref 98–111)
Chloride: 100 mmol/L (ref 98–111)
Creatinine, Ser: 0.81 mg/dL (ref 0.44–1.00)
Creatinine, Ser: 0.8 mg/dL (ref 0.44–1.00)
Creatinine, Ser: 0.92 mg/dL (ref 0.44–1.00)
Glucose, Bld: 114 mg/dL — ABNORMAL HIGH (ref 70–99)
Glucose, Bld: 91 mg/dL (ref 70–99)
Glucose, Bld: 107 mg/dL — ABNORMAL HIGH (ref 70–99)
Potassium: 3 mmol/L — ABNORMAL LOW (ref 3.5–5.1)
Potassium: 3.1 mmol/L — ABNORMAL LOW (ref 3.5–5.1)
Potassium: 2.7 mmol/L — CL (ref 3.5–5.1)
SODIUM: 134 mmol/L — AB (ref 135–145)
Sodium: 136 mmol/L (ref 135–145)
Sodium: 135 mmol/L (ref 135–145)

## 2018-03-30 LAB — POTASSIUM: Potassium: 2.5 mmol/L — CL (ref 3.5–5.1)

## 2018-03-30 MED ORDER — LEVETIRACETAM 500 MG PO TABS
500.0000 mg | ORAL_TABLET | Freq: Two times a day (BID) | ORAL | Status: DC
  Administered 2018-03-30 – 2018-03-31 (×2): 500 mg via ORAL
  Filled 2018-03-30 (×2): qty 1

## 2018-03-30 MED ORDER — POTASSIUM CHLORIDE CRYS ER 20 MEQ PO TBCR
30.0000 meq | EXTENDED_RELEASE_TABLET | Freq: Two times a day (BID) | ORAL | Status: DC
  Administered 2018-03-30 – 2018-03-31 (×3): 30 meq via ORAL
  Filled 2018-03-30 (×3): qty 1

## 2018-03-30 MED ORDER — DESMOPRESSIN ACETATE 0.2 MG PO TABS
0.2000 mg | ORAL_TABLET | Freq: Two times a day (BID) | ORAL | Status: DC
  Administered 2018-03-30 – 2018-03-31 (×3): 0.2 mg via ORAL
  Filled 2018-03-30 (×3): qty 1

## 2018-03-30 NOTE — Progress Notes (Signed)
Patient ID: Cindy Lamb, female   DOB: 1976/04/29, 42 y.o.   MRN: [id number] BP 122/80   Pulse 60   Temp 98.1 F (36.7 C) (Oral)   Resp 15   Ht 5\' 7"  (1.702 m)   Wt 82.2 kg   SpO2 100%   BMI 28.38 kg/m  Alert, oriented x 4, speech is clear, and fluent Moving all extremities well Will switch to po's Discontinue foley catheter

## 2018-03-30 NOTE — Progress Notes (Signed)
Inpatient Rehabilitation Admissions Coordinator  Patient has progressed well with therapy and Home health is now recommended.  Danne Baxter, RN, MSN Rehab Admissions Coordinator [phone] 03/30/2018 6:32 PM

## 2018-03-31 LAB — MAGNESIUM: MAGNESIUM: 2.1 mg/dL (ref 1.7–2.4)

## 2018-03-31 LAB — SODIUM: Sodium: 138 mmol/L (ref 135–145)

## 2018-03-31 LAB — POTASSIUM: Potassium: 3.5 mmol/L (ref 3.5–5.1)

## 2018-03-31 MED ORDER — HYDROCORTISONE 10 MG PO TABS: 10.0000 mg | ORAL_TABLET | Freq: Every evening | ORAL | 1 refills | Status: DC

## 2018-03-31 MED ORDER — DESMOPRESSIN ACETATE 0.2 MG PO TABS: 0.2000 mg | ORAL_TABLET | Freq: Two times a day (BID) | ORAL | 1 refills | Status: DC

## 2018-03-31 MED ORDER — HYDROCORTISONE 20 MG PO TABS: 20.0000 mg | ORAL_TABLET | Freq: Every morning | ORAL | 1 refills | Status: DC

## 2018-03-31 MED ORDER — LEVETIRACETAM 500 MG PO TABS: 500.0000 mg | ORAL_TABLET | Freq: Two times a day (BID) | ORAL | 0 refills | Status: DC

## 2018-03-31 MED ORDER — LEVOTHYROXINE SODIUM 50 MCG PO TABS: 50.0000 ug | ORAL_TABLET | Freq: Every day | ORAL | 2 refills | Status: DC

## 2018-03-31 NOTE — Progress Notes (Signed)
Occupational Therapy Treatment Patient Details Name: Cindy Lamb [facility] MRN: [id number] DOB: 1975-09-30 Today's Date: 03/31/2018    History of present illness 42 y.o. female known cystic sellar/suprasellar mass mass. Underwent Stereotactic right pterional craniotomy for resection of suprasellar tumor 10/4.    OT comments  Pt progressing towards established OT goals. Pt performing LB dressing, tub transfer, and functional mobility at [place] level for safety. Pt continues to present with decreased attention and requires Min cues. Pt presenting with increased awareness stating she doesn't "feel back to normal when it comes to thinking." Providing education on compensatory techniques for grooming and LB ADLs as well as techniques to decreased stimuli if she feels overstimulated. Update dc recommendation to home with HHOT and will continue to follow acutely as admitted.    Follow Up Recommendations  Home health OT;Supervision/Assistance - 24 hour    Equipment Recommendations  3 in 1 bedside commode    Recommendations for Other Services Rehab consult    Precautions / Restrictions Precautions Precautions: Fall Restrictions Weight Bearing Restrictions: No       Mobility Bed Mobility Overal bed mobility: Modified Independent             General bed mobility comments: Increased time  Transfers Overall transfer level: Needs assistance   Transfers: Sit to/from Stand Sit to Stand: Min guard         General transfer comment: Min Guard A for safety    Balance Overall balance assessment: Needs assistance Sitting-balance support: Feet supported;No upper extremity supported Sitting balance-Leahy Scale: Fair     Standing balance support: During functional activity;Single extremity supported Standing balance-Leahy Scale: Fair                             ADL either performed or assessed with clinical judgement   ADL Overall ADL's : Needs  assistance/impaired Eating/Feeding: Independent Eating/Feeding Details (indicate cue type and reason): Pt self feeding herself lunch at end of session while seated in recliner   Grooming Details (indicate cue type and reason): Educating pt on compensatory techniques for oral care at sink to prevent forward lean. Pt verbalized understanding.             Lower Body Dressing: Min guard Lower Body Dressing Details (indicate cue type and reason): Min Guard A for safety while standing. Pt able to long sit in bed to don socks. Reviewed compensatory techniques for LB dressing including bringing ankles to knees. Pt verbalized udnerstanding and techniques and importance to prevent forward bending.  Toilet Transfer: Min guard;Ambulation(simulated to recliner)       Tub/ Shower Transfer: Min guard;3 in Risk manager Details (indicate cue type and reason): Pt performing tub transafer with [employer] A for safety and using 3N1 as seat Functional mobility during ADLs: Min guard General ADL Comments: Pt demosntrating increased functional performance. Pt performing LB dressing, tub transfer, and functional mobiltiy with Mi nGuard A. Continues to present with decreased cognition compared to baseline. Educating pt on techniques to decrease stimuli if needed at home (such as reducing light and sound)     Vision       Perception     Praxis      Cognition Arousal/Alertness: Awake/alert Behavior During Therapy: WFL for tasks assessed/performed Overall Cognitive Status: Impaired/Different from baseline Area of Impairment: Attention;Problem solving                   Current Attention  Level: Alternating         Problem Solving: Requires verbal cues General Comments: Pt distractable and requiring increased cues for attention. Pt able to participate in discussion about cognition and pt reportign she doesn't feel back to baseline.         Exercises     Shoulder  Instructions       General Comments Friend present in room    Pertinent Vitals/ Pain       Pain Assessment: Faces Faces Pain Scale: No hurt Pain Intervention(s): Monitored during session  Home Living                                          Prior Functioning/Environment              Frequency  Min 2X/week        Progress Toward Goals  OT Goals(current goals can now be found in the care plan section)  Progress towards OT goals: Progressing toward goals  Acute Rehab OT Goals Patient Stated Goal: got to get back going OT Goal Formulation: Patient unable to participate in goal setting Time For Goal Achievement: 04/09/18 Potential to Achieve Goals: Good ADL Goals Pt Will Perform Eating: with mod assist;sitting Pt Will Perform Grooming: with supervision;standing Pt Will Perform Upper Body Bathing: with supervision;standing Pt Will Perform Lower Body Bathing: with supervision;sit to/from stand Pt Will Perform Upper Body Dressing: with supervision;sitting Pt Will Perform Lower Body Dressing: with supervision;sit to/from stand Pt Will Transfer to Toilet: with supervision;ambulating;regular height toilet;grab bars Pt Will Perform Toileting - Clothing Manipulation and hygiene: with supervision;sit to/from stand Additional ADL Goal #1: Pt will selectively attend to ADL tasks with no verbal cues  Plan Discharge plan needs to be updated;Frequency remains appropriate    Co-evaluation                 AM-PAC PT "6 Clicks" Daily Activity     Outcome Measure   Help from another person eating meals?: None Help from another person taking care of personal grooming?: A Little Help from another person toileting, which includes using toliet, bedpan, or urinal?: A Little Help from another person bathing (including washing, rinsing, drying)?: A Little Help from another person to put on and taking off regular upper body clothing?: A Little Help from another  person to put on and taking off regular lower body clothing?: A Little 6 Click Score: 19    End of Session    OT Visit Diagnosis: Unsteadiness on feet (R26.81);Cognitive communication deficit (R41.841) Pain - part of body: (head?)   Activity Tolerance Patient tolerated treatment well   Patient Left with call bell/phone within reach;in chair;with family/visitor present   Nurse Communication Mobility status        Time: 1250-1307 OT Time Calculation (min): 17 min  Charges: OT General Charges $OT Visit: 1 Visit OT Treatments $Self Care/Home Management : 8-22 mins  [place], OTR/L Acute Rehab Pager: [phone] Office: [place] 03/31/2018, 2:40 PM

## 2018-03-31 NOTE — Progress Notes (Signed)
NURSING PROGRESS NOTE  [place] [id number] Discharge Data: 03/31/2018 2:55 PM Attending Provider: Consuella Lose, MD LSL:[id number], Eloisa Northern to be D/C'd Home per MD order.  Discussed with the patient the After Visit Summary and all questions fully answered. All IV's discontinued with no bleeding noted. All belongings returned to patient for patient to take home.   Last Vital Signs:  Blood pressure 91/67, pulse (!) 57, temperature 97.8 F (36.6 C), temperature source Oral, resp. rate 16, height 5\' 7"  (1.702 m), weight 82.2 kg, SpO2 100 %.  Discharge Medication List Allergies as of 03/31/2018      Reactions   Latex Rash      Medication List    STOP taking these medications   cephALEXin 500 MG capsule Commonly known as:  KEFLEX   diphenhydramine-acetaminophen 25-500 MG Tabs tablet Commonly known as:  TYLENOL PM   HYDROcodone-acetaminophen 5-325 MG tablet Commonly known as:  NORCO/VICODIN   ondansetron 4 MG tablet Commonly known as:  ZOFRAN   predniSONE 1 MG tablet Commonly known as:  DELTASONE   sodium chloride 0.65 % Soln nasal spray Commonly known as:  OCEAN     TAKE these medications   cetirizine 10 MG tablet Commonly known as:  ZYRTEC Take 10 mg by mouth at bedtime.   CLEAR EYES OP Place 1 drop into both eyes daily as needed (dry/red eyes).   desmopressin 0.2 MG tablet Commonly known as:  DDAVP Take 1 tablet (0.2 mg total) by mouth 2 (two) times daily. What changed:    medication strength  how much to take  when to take this   famotidine 20 MG tablet Commonly known as:  PEPCID Take 20 mg by mouth daily.   fluticasone 50 MCG/ACT nasal spray Commonly known as:  FLONASE Place 2 sprays into both nostrils 2 (two) times daily.   gabapentin 100 MG capsule Commonly known as:  NEURONTIN Take 1 capsule (100 mg total) by mouth 3 (three) times daily. What changed:  how much to take   hydrocortisone 10 MG tablet Commonly  known as:  CORTEF Take 1 tablet (10 mg total) by mouth every evening.   hydrocortisone 20 MG tablet Commonly known as:  CORTEF Take 1 tablet (20 mg total) by mouth every morning. Start taking on:  04/01/2018   levETIRAcetam 500 MG tablet Commonly known as:  KEPPRA Take 1 tablet (500 mg total) by mouth 2 (two) times daily.   levothyroxine 50 MCG tablet Commonly known as:  SYNTHROID, LEVOTHROID Take 1 tablet (50 mcg total) by mouth daily before breakfast. Start taking on:  04/01/2018 What changed:  See the new instructions.   multivitamin tablet Take 1 tablet by mouth daily.   valACYclovir 1000 MG tablet Commonly known as:  VALTREX Take 1,000 mg by mouth daily.   Vitamin D-3 5000 units Tabs Take 5,000 Units by mouth daily.            Durable Medical Equipment  (From admission, onward)         Start     Ordered   03/31/18 1331  For home use only DME 3 n 1  Once     03/31/18 1330

## 2018-03-31 NOTE — Care Management Note (Signed)
Case Management Note  Patient Details  Name: Cindy Lamb [facility] MRN: [id number] Date of Birth: [birth year]  Subjective/Objective:                    Action/Plan:  Spoke with patient, she would like to use [facility] for [facility] and DME. Referrals placed to Brad/ Reggie. No other CM needs identified.  3/1 to be delivered to room prior to DC.   Expected Discharge Date:  03/31/18               Expected Discharge Plan:  [place]  In-House Referral:     Discharge planning Services  CM Consult  Post Acute Care Choice:  Home Health, Durable Medical Equipment Choice offered to:  Patient  DME Arranged:  3-N-1 DME Agency:  [place]:  PT, OT [facility] Agency:  [place]  Status of Service:  Completed, signed off  If discussed at [place] of Stay Meetings, dates discussed:    Additional Comments:  Carles Collet, RN 03/31/2018, 1:32 PM

## 2018-03-31 NOTE — Discharge Summary (Signed)
Physician Discharge Summary  Patient ID: Cindy Lamb [facility] MRN: [id number] DOB/AGE: 27-May-1976 42 y.o.  Admit date: 03/22/2018 Discharge date: 03/31/2018  Admission Diagnoses:suprasellar mass  Discharge Diagnoses: epidermoid cyst Active Problems:   Status post craniotomy   Suprasellar mass   Discharged Condition: good  Hospital Course: [name] was admitted and taken to the operating room for a stereotactic craniotomy and cyst resection. Post op she has done very well. Her wound is clean, dry, and without signs of infection. She has had her DI controlled with DDavp. She is voiding, and tolerating a regular diet, ambulating well. HOME health therapy has been ordered  Treatments: surgery: Stereotactic right pterional craniotomy for resection of suprasellar tumor Use of intraoperative microscope for microdissection.   Discharge Exam: Blood pressure 91/67, pulse (!) 57, temperature 97.8 F (36.6 C), temperature source Oral, resp. rate 16, height 5\' 7"  (1.702 m), weight 82.2 kg, SpO2 100 %. General appearance: alert, cooperative and no distress Neurologic: Alert and oriented X 3, normal strength and tone. Normal symmetric reflexes. Normal coordination and gait  Disposition: Discharge disposition: 01-Home or Self Care      SUPRASELLAR MASS  Allergies as of 03/31/2018      Reactions   Latex Rash      Medication List    STOP taking these medications   cephALEXin 500 MG capsule Commonly known as:  KEFLEX   diphenhydramine-acetaminophen 25-500 MG Tabs tablet Commonly known as:  TYLENOL PM   HYDROcodone-acetaminophen 5-325 MG tablet Commonly known as:  NORCO/VICODIN   ondansetron 4 MG tablet Commonly known as:  ZOFRAN   predniSONE 1 MG tablet Commonly known as:  DELTASONE   sodium chloride 0.65 % Soln nasal spray Commonly known as:  OCEAN     TAKE these medications   cetirizine 10 MG tablet Commonly known as:  ZYRTEC Take 10 mg by mouth at bedtime.   CLEAR EYES  OP Place 1 drop into both eyes daily as needed (dry/red eyes).   desmopressin 0.2 MG tablet Commonly known as:  DDAVP Take 1 tablet (0.2 mg total) by mouth 2 (two) times daily. What changed:    medication strength  how much to take  when to take this   famotidine 20 MG tablet Commonly known as:  PEPCID Take 20 mg by mouth daily.   fluticasone 50 MCG/ACT nasal spray Commonly known as:  FLONASE Place 2 sprays into both nostrils 2 (two) times daily.   gabapentin 100 MG capsule Commonly known as:  NEURONTIN Take 1 capsule (100 mg total) by mouth 3 (three) times daily. What changed:  how much to take   hydrocortisone 10 MG tablet Commonly known as:  CORTEF Take 1 tablet (10 mg total) by mouth every evening.   hydrocortisone 20 MG tablet Commonly known as:  CORTEF Take 1 tablet (20 mg total) by mouth every morning. Start taking on:  04/01/2018   levETIRAcetam 500 MG tablet Commonly known as:  KEPPRA Take 1 tablet (500 mg total) by mouth 2 (two) times daily.   levothyroxine 50 MCG tablet Commonly known as:  SYNTHROID, LEVOTHROID Take 1 tablet (50 mcg total) by mouth daily before breakfast. Start taking on:  04/01/2018 What changed:  See the new instructions.   multivitamin tablet Take 1 tablet by mouth daily.   valACYclovir 1000 MG tablet Commonly known as:  VALTREX Take 1,000 mg by mouth daily.   Vitamin D-3 5000 units Tabs Take 5,000 Units by mouth daily.      Follow-up Information  Consuella Lose, MD Follow up in 1 week(s).   Specialty:  Neurosurgery Why:  please call to make an appointment for staple removal Contact information: [address] [place] [address] [phone]           Signed: Winfield Cunas 03/31/2018, 12:44 PM

## 2018-05-14 ENCOUNTER — Ambulatory Visit (INDEPENDENT_AMBULATORY_CARE_PROVIDER_SITE_OTHER): Payer: BLUE CROSS/BLUE SHIELD | Admitting: Endocrinology

## 2018-05-14 ENCOUNTER — Encounter: Payer: Self-pay | Admitting: Endocrinology

## 2018-05-14 VITALS — BP 122/70 | HR 88 | Ht 67.0 in | Wt 192.2 lb

## 2018-05-14 DIAGNOSIS — D352 Benign neoplasm of pituitary gland: Secondary | ICD-10-CM

## 2018-05-14 DIAGNOSIS — M791 Myalgia, unspecified site: Secondary | ICD-10-CM

## 2018-05-14 LAB — URINALYSIS, ROUTINE W REFLEX MICROSCOPIC
Bilirubin Urine: NEGATIVE
HGB URINE DIPSTICK: NEGATIVE
Ketones, ur: NEGATIVE
LEUKOCYTES UA: NEGATIVE
Nitrite: NEGATIVE
PH: 5 (ref 5.0–8.0)
RBC / HPF: NONE SEEN (ref 0–?)
TOTAL PROTEIN, URINE-UPE24: NEGATIVE
Urine Glucose: NEGATIVE
Urobilinogen, UA: 0.2 (ref 0.0–1.0)

## 2018-05-14 LAB — BASIC METABOLIC PANEL
BUN: 6 mg/dL (ref 6–23)
CHLORIDE: 103 meq/L (ref 96–112)
CO2: 27 meq/L (ref 19–32)
Calcium: 10.3 mg/dL (ref 8.4–10.5)
Creatinine, Ser: 0.84 mg/dL (ref 0.40–1.20)
GFR: 96.22 mL/min (ref >60.00)
GLUCOSE: 108 mg/dL — AB (ref 70–99)
POTASSIUM: 3.6 meq/L (ref 3.5–5.1)
SODIUM: 139 meq/L (ref 135–145)

## 2018-05-14 LAB — CORTISOL
CORTISOL PLASMA: 0.8 ug/dL
Cortisol, Plasma: 0 ug/dL

## 2018-05-14 LAB — FOLLICLE STIMULATING HORMONE: FSH: 1.8 m[IU]/mL

## 2018-05-14 LAB — SEDIMENTATION RATE: Sed Rate: 55 mm/hr — ABNORMAL HIGH (ref 0–20)

## 2018-05-14 LAB — T4, FREE: FREE T4: 0.48 ng/dL — AB (ref 0.60–1.60)

## 2018-05-14 LAB — LUTEINIZING HORMONE: LH: 0.19 m[IU]/mL

## 2018-05-14 LAB — TSH: TSH: 0.2 u[IU]/mL — ABNORMAL LOW (ref 0.35–4.50)

## 2018-05-14 MED ORDER — PREDNISONE 5 MG PO TABS: 5.0000 mg | ORAL_TABLET | Freq: Every day | ORAL | 3 refills | Status: DC

## 2018-05-14 MED ORDER — LEVOTHYROXINE SODIUM 75 MCG PO TABS: 75.0000 ug | ORAL_TABLET | Freq: Every day | ORAL | 3 refills | Status: DC

## 2018-05-14 MED ORDER — COSYNTROPIN NICU IV SYRINGE 0.25 MG/ML (STANDARD DOSE)
0.2500 mg | Freq: Once | INTRAVENOUS | Status: AC
  Administered 2018-05-14: 0.25 mg via INTRAMUSCULAR

## 2018-05-14 NOTE — Patient Instructions (Addendum)
blood and urine tests are requested for you today.  We'll let you know about the results.   Please measure the amount of urine your body makes per day, and let us know.  Please come back for a follow-up appointment in 2 weeks.

## 2018-05-14 NOTE — Progress Notes (Signed)
Subjective:    Patient ID: Cindy Lamb, female    DOB: July 01, 1975, 42 y.o.   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  She had resection 6 weeks ago.  She has moderate pain throughout the body, and assoc fatigue.  She takes synthroid and d-DAVP, but not prednisone.   Past Medical History:  Diagnosis Date  . Anemia   . Anxiety   . 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 Dyspnea    not normally  . Dysrhythmia    palpitations occasionally with chest pain (stabbing pain)   PAC's  . GERD (gastroesophageal reflux disease)   . High cholesterol   . Hypothyroidism   . IBS (irritable bowel syndrome)   . Low back pain    2 bulging discs  . Migraines   . Spinal stenosis    Mild    Past Surgical History:  Procedure Laterality Date  . ABDOMINAL HYSTERECTOMY    . APPLICATION OF CRANIAL NAVIGATION Right 03/22/2018   Procedure: APPLICATION OF CRANIAL NAVIGATION;  Surgeon: Consuella Lose, MD;  Location: [place];  Service: Neurosurgery;  Laterality: Right;  APPLICATION OF CRANIAL NAVIGATION  . CRANIOTOMY N/A 02/01/2018   Procedure: CRANIOTOMY TRANSPHENOIDAL RESECTION OF PITUITARY TUMOR;  Surgeon: Consuella Lose, MD;  Location: [place];  Service: Neurosurgery;  Laterality: N/A;  CRANIOTOMY TRANSPHENOIDAL RESECTION OF PITUITARY TUMOR  . CRANIOTOMY Right 03/22/2018   Procedure: STEREOTACTIC RIGHT PTERIONAL CRANIOTOMY, RESECTION OF CRANIOPHARYNGIOMA;  Surgeon: Consuella Lose, MD;  Location: [place];  Service: Neurosurgery;  Laterality: Right;  . CYSTECTOMY  2006  . DILATION AND CURETTAGE OF UTERUS    . NASAL SEPTOPLASTY W/ TURBINOPLASTY N/A 02/01/2018   Procedure: POSSIBLE  NASAL SEPTOPLASTY WITH TURBINATE REDUCTION CONSIDER ENDOSCOPIC  EXCISION OF LEFT CONCHA BULLOSA AND REMOVAL OF LEFT MAXILLARY SINUS MUCOUS RETENTION CYST.;  Surgeon: Jerrell Belfast, MD;  Location: [place];   Service: ENT;  Laterality: N/A;  . ORIF ANKLE FRACTURE BIMALLEOLAR  2010  . PARTIAL HYSTERECTOMY  2006  . TRANSPHENOIDAL APPROACH EXPOSURE N/A 02/01/2018   Procedure: TRANSPHENOIDAL APPROACH EXPOSURE WITH FUSION PROTOCOL;  Surgeon: Jerrell Belfast, MD;  Location: [place];  Service: ENT;  Laterality: N/A;  . wisdom teeth removal      Social History   Socioeconomic History  . Marital status: Single    Spouse name: Not on file  . Number of children: 2  . Years of education: College  . Highest education level: Not on file  Occupational History    Comment: [employer]  . Financial resource strain: Not on file  . Food insecurity:    Worry: Not on file    Inability: Not on file  . Transportation needs:    Medical: Not on file    Non-medical: Not on file  Tobacco Use  . Smoking status: Current Every Day Smoker    Packs/day: 0.25    Years: 18.00    Pack years: 4.50    Types: Cigarettes  . Smokeless tobacco: Never Used  Substance and Sexual Activity  . Alcohol use: No    Alcohol/week: 0.0 standard drinks    Comment: 0-5 drinks per wk  . Drug use: No    Comment: occ marijuana  . Sexual activity: Not on file  Lifestyle  . Physical activity:    Days per week: Not on file    Minutes per session:  Not on file  . Stress: Not on file  Relationships  . Social connections:    Talks on phone: Not on file    Gets together: Not on file    Attends religious service: Not on file    Active member of club or organization: Not on file    Attends meetings of clubs or organizations: Not on file    Relationship status: Not on file  . Intimate partner violence:    Fear of current or ex partner: Not on file    Emotionally abused: Not on file    Physically abused: Not on file    Forced sexual activity: Not on file  Other Topics Concern  . Not on file  Social History Narrative   Lives at home w/ her son   Right-handed   Occasional caffeine    Current  Outpatient Medications on File Prior to Visit  Medication Sig Dispense Refill  . cetirizine (ZYRTEC) 10 MG tablet Take 10 mg by mouth at bedtime.    . Cholecalciferol (VITAMIN D-3) 5000 units TABS Take 5,000 Units by mouth daily.    Marland Kitchen desmopressin (DDAVP) 0.2 MG tablet Take 1 tablet (0.2 mg total) by mouth 2 (two) times daily. 60 tablet 1  . famotidine (PEPCID) 20 MG tablet Take 20 mg by mouth daily.    . fluticasone (FLONASE) 50 MCG/ACT nasal spray Place 2 sprays into both nostrils 2 (two) times daily.     Marland Kitchen gabapentin (NEURONTIN) 100 MG capsule Take 1 capsule (100 mg total) by mouth 3 (three) times daily. (Patient taking differently: Take 300 mg by mouth 3 (three) times daily. ) 60 capsule 5  . Multiple Vitamin (MULTIVITAMIN) tablet Take 1 tablet by mouth daily.    . Naphazoline HCl (CLEAR EYES OP) Place 1 drop into both eyes daily as needed (dry/red eyes).    . valACYclovir (VALTREX) 1000 MG tablet Take 1,000 mg by mouth daily.     No current facility-administered medications on file prior to visit.     Allergies  Allergen Reactions  . Latex Rash    Family History  Problem Relation Age of Onset  . Prostate cancer Maternal Grandfather   . Hypertension Maternal Grandfather   . High Cholesterol Maternal Grandfather   . Hypertension Maternal Grandmother   . High Cholesterol Maternal Grandmother   . Other Neg Hx        pituitary disorder    BP 122/70 (BP Location: Left Arm, Patient Position: Sitting, Cuff Size: Normal)   Pulse 88   Ht 5\' 7"  (1.702 m)   Wt 192 lb 3.2 oz (87.2 kg)   BMI 30.10 kg/m    Review of Systems Denies N/V/HA, but she has nocturia 4 times per night.      Objective:   Physical Exam VITAL SIGNS:  See vs page GENERAL: no distress Gait: normal and steady.     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 Myalgias: poss due to the pit insuff  Patient Instructions  blood and urine tests are requested for you today.  We'll let you know about the results.  Please measure the amount of urine your body makes per day, and let us know.  Please come back for a follow-up appointment in 2 weeks.

## 2018-05-19 LAB — ESTRADIOL, FREE: Estradiol: <2 pg/mL

## 2018-05-19 LAB — ACTH

## 2018-05-19 LAB — PROLACTIN: PROLACTIN: 68.5 ng/mL — AB

## 2018-05-22 LAB — ARGININE VASOPRESSIN HORMONE
ADH: 5.5 pg/mL — ABNORMAL HIGH (ref 0.0–4.7)
Osmolality Meas: 293 mOsmol/kg (ref 275–295)

## 2018-05-29 ENCOUNTER — Encounter: Payer: Self-pay | Admitting: Endocrinology

## 2018-05-29 NOTE — Telephone Encounter (Signed)
??   Was this something Kieth Brightly would manage?

## 2018-06-07 ENCOUNTER — Telehealth: Payer: Self-pay | Admitting: Endocrinology

## 2018-06-07 NOTE — Telephone Encounter (Signed)
MEDICATION: desmopressin (DDAVP) 0.2 MG tablet  PHARMACY: Contacted. [id number] - [place], [place] - [address]   IS THIS A 90 DAY SUPPLY : 30 day  IS PATIENT OUT OF MEDICATION: Yes  IF NOT; HOW MUCH IS LEFT:   LAST APPOINTMENT DATE: @11 /26/2019  NEXT APPOINTMENT DATE:@Visit  date not found  DO WE HAVE YOUR PERMISSION TO LEAVE A DETAILED MESSAGE:  OTHER COMMENTS:    **Let patient know to contact pharmacy at the end of the day to make sure medication is ready. **  ** Please notify patient to allow 48-72 hours to process**  **Encourage patient to contact the pharmacy for refills or they can request refills through [facility]**

## 2018-06-10 ENCOUNTER — Other Ambulatory Visit: Payer: Self-pay

## 2018-06-10 MED ORDER — DESMOPRESSIN ACETATE 0.2 MG PO TABS: 0.2000 mg | ORAL_TABLET | Freq: Two times a day (BID) | ORAL | 3 refills | Status: DC

## 2018-06-10 NOTE — Telephone Encounter (Signed)
This has been sent

## 2018-06-14 ENCOUNTER — Other Ambulatory Visit: Payer: Self-pay

## 2018-06-14 ENCOUNTER — Telehealth: Payer: Self-pay | Admitting: Endocrinology

## 2018-06-14 MED ORDER — DESMOPRESSIN ACETATE 0.2 MG PO TABS: 0.2000 mg | ORAL_TABLET | Freq: Two times a day (BID) | ORAL | 3 refills | Status: DC

## 2018-06-14 NOTE — Telephone Encounter (Signed)
MEDICATION: desmopressin DDAVP  PHARMACY:  [facility]  IS THIS A 90 DAY SUPPLY : 30 day  IS PATIENT OUT OF MEDICATION: y  IF NOT; HOW MUCH IS LEFT:   LAST APPOINTMENT DATE: @12 /23/2019  NEXT APPOINTMENT DATE:@Visit  date not found  DO WE HAVE YOUR PERMISSION TO LEAVE A DETAILED MESSAGE:  OTHER COMMENTS: this rx was attempted however it was marked as printed so it did not actually go thru   **Let patient know to contact pharmacy at the end of the day to make sure medication is ready. **  ** Please notify patient to allow 48-72 hours to process**  **Encourage patient to contact the pharmacy for refills or they can request refills through [facility]**

## 2018-06-14 NOTE — Telephone Encounter (Signed)
rf sent 

## 2018-06-18 ENCOUNTER — Encounter: Payer: Self-pay | Admitting: Endocrinology

## 2018-06-20 NOTE — Telephone Encounter (Signed)
Please advise 

## 2018-08-28 ENCOUNTER — Encounter (HOSPITAL_COMMUNITY): Payer: Self-pay

## 2018-08-28 ENCOUNTER — Ambulatory Visit (HOSPITAL_COMMUNITY)
Admission: EM | Admit: 2018-08-28 | Discharge: 2018-08-28 | Disposition: A | Discharge status: Home / Self Care | Payer: BLUE CROSS/BLUE SHIELD | Attending: Family Medicine | Admitting: Family Medicine

## 2018-08-28 ENCOUNTER — Other Ambulatory Visit: Payer: Self-pay

## 2018-08-28 DIAGNOSIS — R195 Other fecal abnormalities: Secondary | ICD-10-CM

## 2018-08-28 DIAGNOSIS — R110 Nausea: Secondary | ICD-10-CM

## 2018-08-28 DIAGNOSIS — R1031 Right lower quadrant pain: Secondary | ICD-10-CM

## 2018-08-28 LAB — POCT URINALYSIS DIP (DEVICE)
Bilirubin Urine: NEGATIVE
Glucose, UA: NEGATIVE mg/dL
Hgb urine dipstick: NEGATIVE
Ketones, ur: NEGATIVE mg/dL
Leukocytes,Ua: NEGATIVE
Nitrite: NEGATIVE
Protein, ur: NEGATIVE mg/dL
Specific Gravity, Urine: <=1.005 (ref 1.005–1.030)
Urobilinogen, UA: 0.2 mg/dL (ref 0.0–1.0)
pH: 5.5 (ref 5.0–8.0)

## 2018-08-28 LAB — POCT PREGNANCY, URINE: Preg Test, Ur: NEGATIVE

## 2018-08-28 IMAGING — MR MR HEAD WO/W CM
18 series · 48 of 48 positions shown · IV contrast (10 ml multihance)
Comparison: 07/07/2017

CLINICAL DATA: SRS pituitary targeting

EXAM:
MRI HEAD WITHOUT AND WITH CONTRAST
TECHNIQUE: Multiplanar, multiecho pulse sequences of the brain and surrounding
structures were obtained without and with intravenous contrast.
CONTRAST:  10mL MULTIHANCE GADOBENATE DIMEGLUMINE 529 MG/ML IV SOLN

[Series 5: DWI · axial · 3.0mm · 1.50mm/px · z∈[-65,+76]mm · 6 of 88 slices shown (1 of 2)]
[im 1/88]
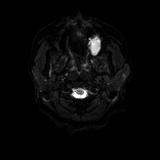
[im 18/88]
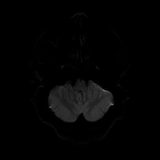
[im 35/88]
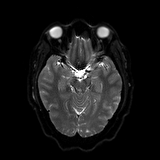
[im 53/88]
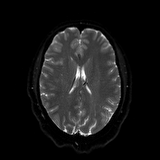
[im 70/88]
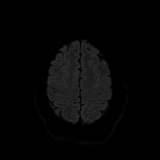
[im 88/88]
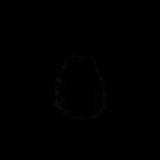

[Series 6: DWI · axial · 3.0mm · 1.50mm/px · z∈[-65,+76]mm · 2 of 44 slices shown (2 of 2)]
[im 1/44]
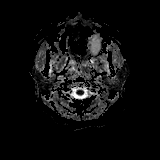
[im 44/44]
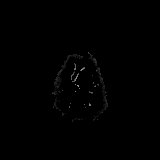

[Series 7: T2 · axial · 5.0mm · 0.57mm/px · 1 of 25 slices shown]
[im 1/25]
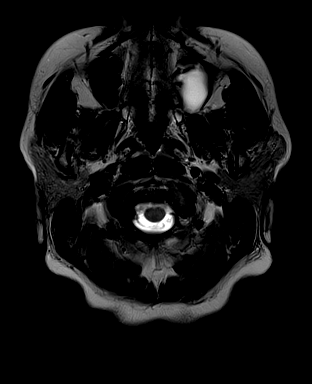

[Series 8: FLAIR · axial · 5.0mm · 0.57mm/px · z∈[-64,+79]mm · 2 of 25 slices shown]
[im 1/25]
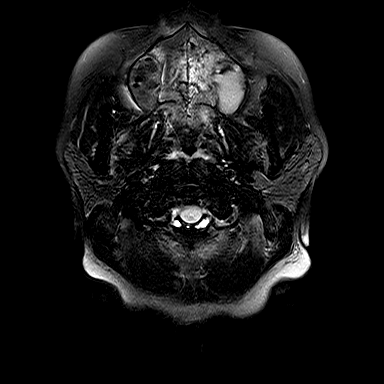
[im 25/25]
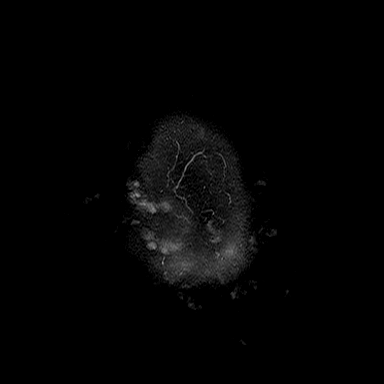

[Series 9: GRE · axial · 5.0mm · 0.69mm/px · z∈[-70,+85]mm · 2 of 27 slices shown]
[im 1/27]
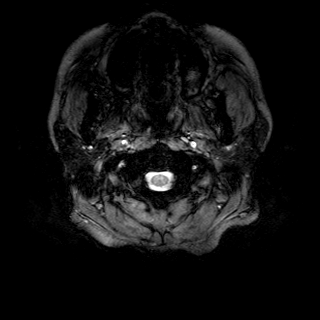
[im 27/27]
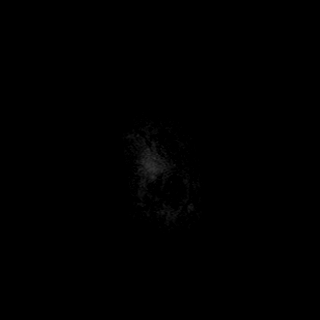

[Series 11: T1 · sagittal · 3.0mm · 0.42mm/px · 1 of 13 slices shown (1 of 4)]
[im 1/13]
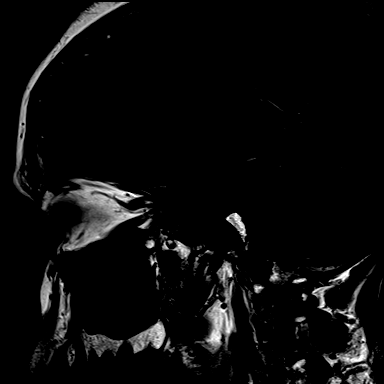

[Series 12: T1 · coronal · 3.0mm · 0.42mm/px · 1 of 13 slices shown (2 of 4)]
[im 1/13]
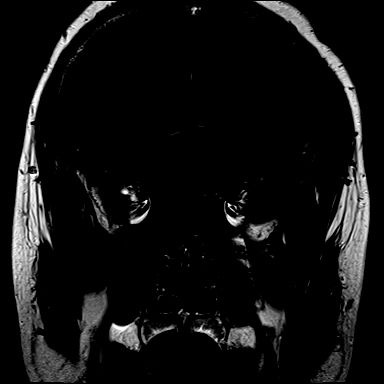

[Series 13: T1 · coronal · non-contrast · 3.0mm · 0.62mm/px · 1 of 9 slices shown (3 of 4)]
[im 1/9]
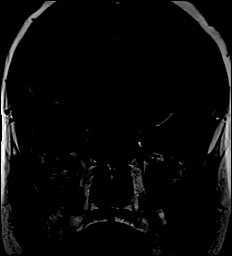

[Series 14: T1 · axial · 1.0mm · 0.69mm/px · z∈[-53,+106]mm · 12 of 160 slices shown (4 of 4)]
[im 1/160]
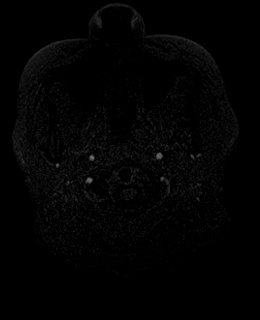
[im 15/160]
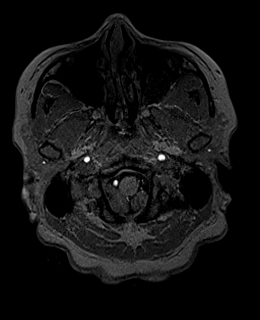
[im 29/160]
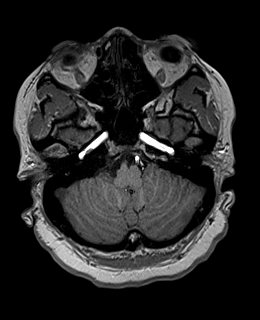
[im 44/160]
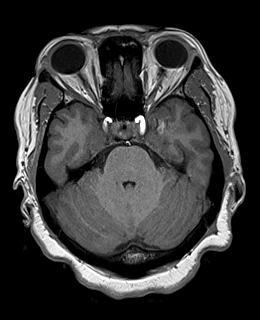
[im 58/160]
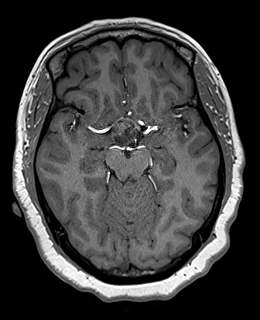
[im 73/160]
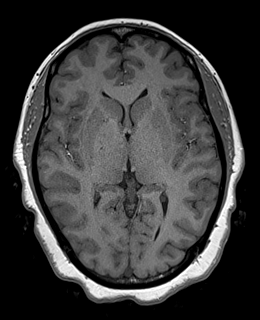
[im 87/160]
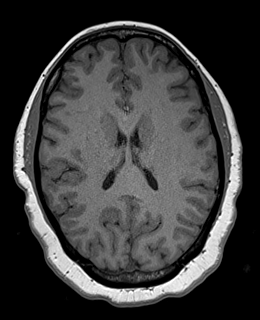
[im 102/160]
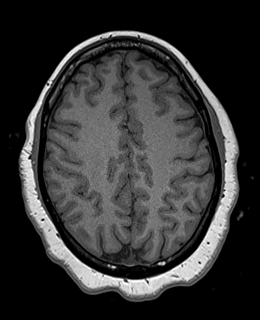
[im 116/160]
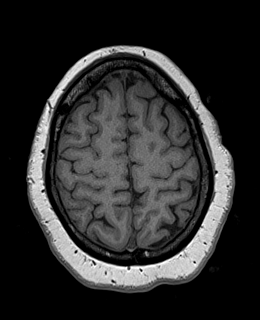
[im 131/160]
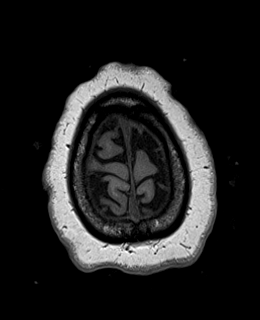
[im 145/160]
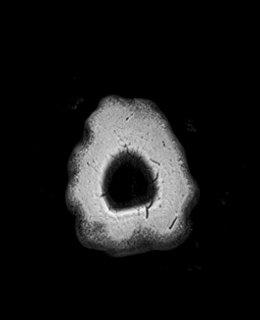
[im 160/160]
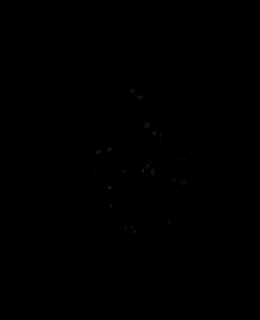

[Series 15: cor post dyn · coronal · 3.0mm · 0.62mm/px · 1 of 9 slices shown (1 of 6)]
[im 1/9]
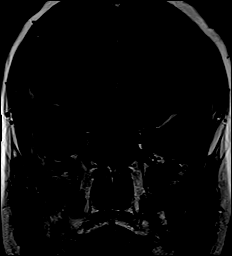

[Series 16: cor post dyn · coronal · 3.0mm · 0.62mm/px · 1 of 9 slices shown (2 of 6)]
[im 1/9]
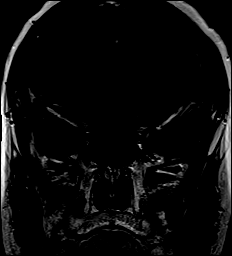

[Series 17: cor post dyn · coronal · 3.0mm · 0.62mm/px · 1 of 9 slices shown (3 of 6)]
[im 1/9]
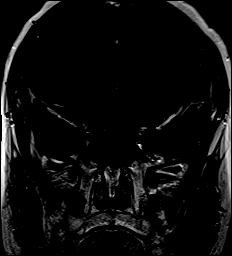

[Series 18: cor post dyn · coronal · 3.0mm · 0.62mm/px · 1 of 9 slices shown (4 of 6)]
[im 1/9]
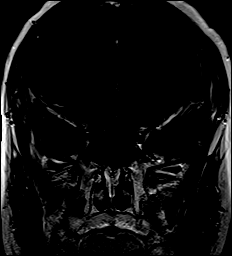

[Series 19: cor post dyn · coronal · 3.0mm · 0.62mm/px · 1 of 9 slices shown (5 of 6)]
[im 1/9]
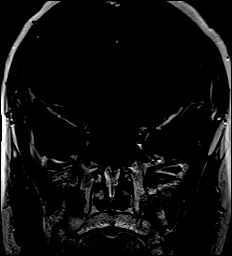

[Series 20: cor post dyn · coronal · 3.0mm · 0.62mm/px · 1 of 9 slices shown (6 of 6)]
[im 1/9]
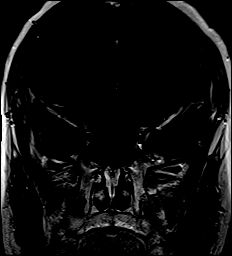

[Series 21: T1 post-contrast · sagittal · 3.0mm · 0.42mm/px · 1 of 13 slices shown (1 of 2)]
[im 1/13]
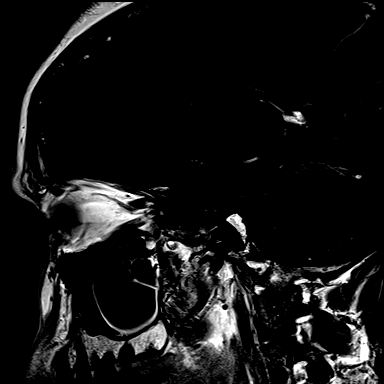

[Series 22: T1 post-contrast · coronal · 3.0mm · 0.42mm/px · 1 of 13 slices shown (2 of 2)]
[im 1/13]
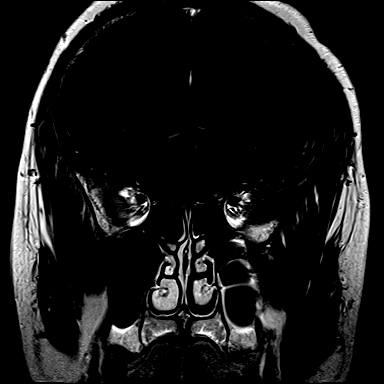

[Series 23: T2 post-contrast · axial · 1.0mm · 0.86mm/px · z∈[-59,+97]mm · 12 of 160 slices shown]
[im 1/160]
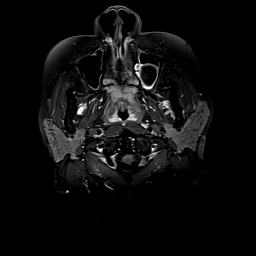
[im 15/160]
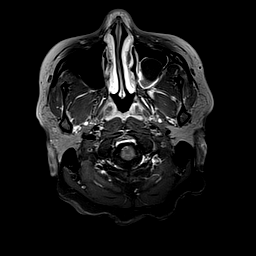
[im 29/160]
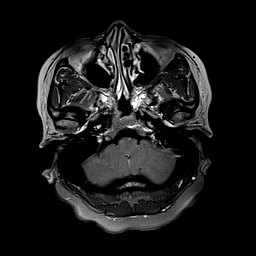
[im 44/160]
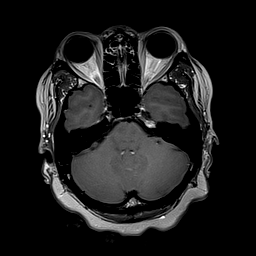
[im 58/160]
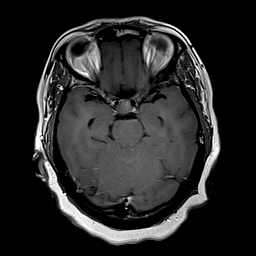
[im 73/160]
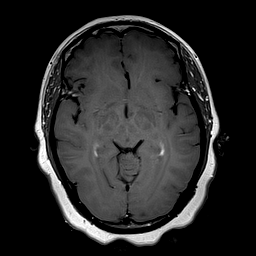
[im 87/160]
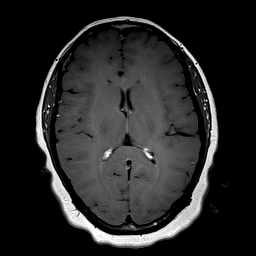
[im 102/160]
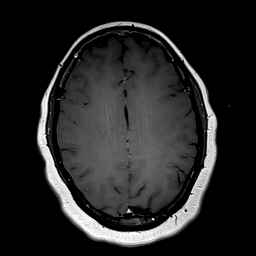
[im 116/160]
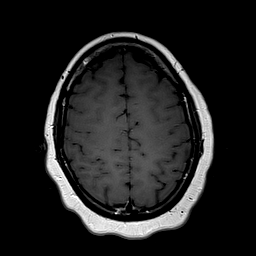
[im 131/160]
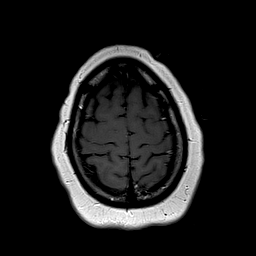
[im 145/160]
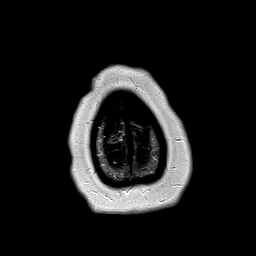
[im 160/160]
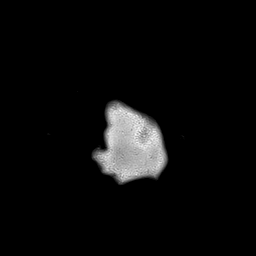

[48 of 48 positions shown; findings below may reference images not displayed]

FINDINGS: Brain: Known sellar and suprasellar mass distinguishable from the
anterior pituitary and contiguous with the dorsal pituitary. Mass
epicenter may be suprasellar where there is the largest component.
Mass continues to have mildly irregular mural enhancement and
internal nonenhancing proteinaceous appearing material. Mass has
enlarged from prior, currently 18 x 19 x 23 mm compared to 17 x 19 x
21 mm. A new small nonenhancing daughter cyst is seen left and
superiorly at the level of the hypothalamus. There is broad contact
and mass effect on the chiasm. The mass contacts the basilar
terminus posteriorly. Expansive sellar sinus. There is a cleft
between the anterior wall of the sella and the mass.There is mass
effect on the third ventricular floor without hydrocephalus. The
craniopharyngioma is still favored over Rathke's cleft cyst given
the degree of complexity. There is a T2 hypointense nodule
anteriorly, but nonspecific given the degree of heterogeneity. Given
the limited contact with the dorsal pituitary gland a cystic adenoma
is considered less likely.

Brain itself has a normal appearance.

Vascular: No cavernous sinus invasion. No apparent narrowing of the
adjacent A1 segments.

Skull and upper cervical spine: No evidence of marrow lesion

Sinuses/Orbits: Recent sinus CT.  Negative orbits
IMPRESSION: 1. Known sellar and suprasellar cystic mass with 2 mm of growth
since June 2017 and a new apparent daughter cyst in the left
hypothalamic region. Craniopharyngioma remains the favored
pathology, as above. A CSF cleft is present between the anterior
wall of the sella and the mass itself.
2. Extensive mass effect on the optic chiasm. The mass contacts the
A1 segments and basilar terminus. Mass effect on the third
ventricular floor without hydrocephalus.

## 2018-08-28 MED ORDER — DICYCLOMINE HCL 20 MG PO TABS: 20.0000 mg | ORAL_TABLET | Freq: Two times a day (BID) | ORAL | 0 refills | Status: DC

## 2018-08-28 MED ORDER — ONDANSETRON HCL 4 MG PO TABS: 4.0000 mg | ORAL_TABLET | Freq: Four times a day (QID) | ORAL | 0 refills | Status: DC

## 2018-08-28 NOTE — Discharge Instructions (Signed)
Offered further evaluation and management in the ED for abdominal discomfort.  Patient declines at this time and would like to try outpatient therapy Urine did not show signs of infection Rest and push fluids Bentyl and zofran prescribed.  Take as directed If symptoms do not improve with medications within the next 24-48 hours please go to the ED or follow up with PCP for further evaluation and management If you experience new or worsening symptoms return or go to ER such as fever, chills, nausea, vomiting, diarrhea, bloody or dark tarry stools, constipation, urinary symptoms, worsening abdominal discomfort, 10/10 pain, symptoms that do not improve with medications, inability to keep fluids down, etc..Marland Kitchen

## 2018-08-28 NOTE — ED Triage Notes (Signed)
Pt presents to [facility] for nausea and cough x2 weeks, pt also complains of diarrhea watery and thin, orange in color x1 week and LRQ pain and LL back pain, pt denies any injuries and has not taken any medication to treat symptoms

## 2018-08-28 NOTE — ED Provider Notes (Signed)
Cindy Lamb   [id number] 08/28/18 Arrival Time: 1858  CC: ABDOMINAL DISCOMFORT  SUBJECTIVE:  Cindy Lamb is a 43 y.o. female hx significant for brain tumor, GERD, HLD, hypothyroid, IBS, who presents with complaint of abdominal discomfort that began 3-4 days ago.  Denies a precipitating event, trauma, close contacts with similar symptoms, recent travel or antibiotic use. Localizes pain to RUQ.  Describes as constant and sharp and achy in character.  Has tried OTC medications without relief.  Symptoms made worse with eating.  Denies similar symptoms in the past.  Last BM this morning with watery stool and mucus.  Complains of associated nausea, decreased appetite, frequent episodes of diarrhea, increased bloating, chills, migraine, and gas.     Denies fever, vomiting, chest pain, SOB, constipation, hematochezia, melena, dysuria, difficulty urinating, increased frequency or urgency, flank pain, loss of bowel or bladder function.  No LMP recorded. Patient has had a hysterectomy.  ROS: As per HPI.  Past Medical History:  Diagnosis Date  . Anemia   . Anxiety   . 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 Dyspnea    not normally  . Dysrhythmia    palpitations occasionally with chest pain (stabbing pain)   PAC's  . GERD (gastroesophageal reflux disease)   . High cholesterol   . Hypothyroidism   . IBS (irritable bowel syndrome)   . Low back pain    2 bulging discs  . Migraines   . Spinal stenosis    Mild   Past Surgical History:  Procedure Laterality Date  . ABDOMINAL HYSTERECTOMY    . APPLICATION OF CRANIAL NAVIGATION Right 03/22/2018   Procedure: APPLICATION OF CRANIAL NAVIGATION;  Surgeon: Consuella Lose, MD;  Location: [facility];  Service: Neurosurgery;  Laterality: Right;  APPLICATION OF CRANIAL NAVIGATION  . CRANIOTOMY N/A 02/01/2018   Procedure: CRANIOTOMY TRANSPHENOIDAL RESECTION OF PITUITARY TUMOR;  Surgeon: Consuella Lose, MD;  Location: [place];  Service:  Neurosurgery;  Laterality: N/A;  CRANIOTOMY TRANSPHENOIDAL RESECTION OF PITUITARY TUMOR  . CRANIOTOMY Right 03/22/2018   Procedure: STEREOTACTIC RIGHT PTERIONAL CRANIOTOMY, RESECTION OF CRANIOPHARYNGIOMA;  Surgeon: Consuella Lose, MD;  Location: [facility];  Service: Neurosurgery;  Laterality: Right;  . CYSTECTOMY  2006  . DILATION AND CURETTAGE OF UTERUS    . NASAL SEPTOPLASTY W/ TURBINOPLASTY N/A 02/01/2018   Procedure: POSSIBLE  NASAL SEPTOPLASTY WITH TURBINATE REDUCTION CONSIDER ENDOSCOPIC  EXCISION OF LEFT CONCHA BULLOSA AND REMOVAL OF LEFT MAXILLARY SINUS MUCOUS RETENTION CYST.;  Surgeon: Jerrell Belfast, MD;  Location: [facility];  Service: ENT;  Laterality: N/A;  . ORIF ANKLE FRACTURE BIMALLEOLAR  2010  . PARTIAL HYSTERECTOMY  2006  . TRANSPHENOIDAL APPROACH EXPOSURE N/A 02/01/2018   Procedure: TRANSPHENOIDAL APPROACH EXPOSURE WITH FUSION PROTOCOL;  Surgeon: Jerrell Belfast, MD;  Location: [facility] [facility];  Service: ENT;  Laterality: N/A;  . wisdom teeth removal     Allergies  Allergen Reactions  . Latex Rash   No current facility-administered medications on file prior to encounter.    Current Outpatient Medications on File Prior to Encounter  Medication Sig Dispense Refill  . cetirizine (ZYRTEC) 10 MG tablet Take 10 mg by mouth at bedtime.    . Cholecalciferol (VITAMIN D-3) 5000 units TABS Take 5,000 Units by mouth daily.    Marland Kitchen desmopressin (DDAVP) 0.2 MG tablet Take 1 tablet (0.2 mg total) by mouth 2 (two) times daily. 60 tablet 3  . fluticasone (FLONASE) 50 MCG/ACT nasal spray Place 2 sprays into both nostrils 2 (two) times daily.     Marland Kitchen  gabapentin (NEURONTIN) 100 MG capsule Take 1 capsule (100 mg total) by mouth 3 (three) times daily. (Patient taking differently: Take 300 mg by mouth 3 (three) times daily. ) 60 capsule 5  . levothyroxine (SYNTHROID, LEVOTHROID) 75 MCG tablet Take 1 tablet (75 mcg total) by mouth daily. 90 tablet 3  . Naphazoline HCl (CLEAR EYES OP) Place 1 drop into both eyes  daily as needed (dry/red eyes).    . valACYclovir (VALTREX) 1000 MG tablet Take 1,000 mg by mouth daily.    . famotidine (PEPCID) 20 MG tablet Take 20 mg by mouth daily.    . Multiple Vitamin (MULTIVITAMIN) tablet Take 1 tablet by mouth daily.     Social History   Socioeconomic History  . Marital status: Single    Spouse name: Not on file  . Number of children: 2  . Years of education: College  . Highest education level: Not on file  Occupational History    Comment: [employer]  . Financial resource strain: Not on file  . Food insecurity:    Worry: Not on file    Inability: Not on file  . Transportation needs:    Medical: Not on file    Non-medical: Not on file  Tobacco Use  . Smoking status: Current Every Day Smoker    Packs/day: 0.25    Years: 18.00    Pack years: 4.50    Types: Cigarettes  . Smokeless tobacco: Never Used  Substance and Sexual Activity  . Alcohol use: No    Alcohol/week: 0.0 standard drinks    Comment: 0-5 drinks per wk  . Drug use: No    Comment: occ marijuana  . Sexual activity: Not on file  Lifestyle  . Physical activity:    Days per week: Not on file    Minutes per session: Not on file  . Stress: Not on file  Relationships  . Social connections:    Talks on phone: Not on file    Gets together: Not on file    Attends religious service: Not on file    Active member of club or organization: Not on file    Attends meetings of clubs or organizations: Not on file    Relationship status: Not on file  . Intimate partner violence:    Fear of current or ex partner: Not on file    Emotionally abused: Not on file    Physically abused: Not on file    Forced sexual activity: Not on file  Other Topics Concern  . Not on file  Social History Narrative   Lives at home w/ her son   Right-handed   Occasional caffeine   Family History  Problem Relation Age of Onset  . Prostate cancer Maternal Grandfather   .  Hypertension Maternal Grandfather   . High Cholesterol Maternal Grandfather   . Hypertension Maternal Grandmother   . High Cholesterol Maternal Grandmother   . Other Neg Hx        pituitary disorder     OBJECTIVE:  Vitals:   08/28/18 1938  BP: 121/79  Pulse: (!) 101  Resp: 18  Temp: 98.3 F (36.8 C)  TempSrc: Oral  SpO2: 100%    General appearance: Alert; NAD HEENT: NCAT.  Oropharynx clear.  Lungs: clear to auscultation bilaterally without adventitious breath sounds Heart: regular rate and rhythm.  Radial pulses 2+ symmetrical bilaterally Abdomen: soft, non-distended; hypoactive bowel sounds; TTP over RLQ and RT flank; tender at  McBurney's point; negative Murphy's sign; negative rebound; no guarding Back: no CVA tenderness Extremities: no edema; symmetrical with no gross deformities Skin: warm and dry Neurologic: normal gait Psychological: alert and cooperative; normal mood and affect  LABS: Results for orders placed or performed during the hospital encounter of 08/28/18 (from the past 24 hour(s))  POCT urinalysis dip (device)     Status: None   Collection Time: 08/28/18  8:16 PM  Result Value Ref Range   Glucose, UA NEGATIVE NEGATIVE mg/dL   Bilirubin Urine NEGATIVE NEGATIVE   Ketones, ur NEGATIVE NEGATIVE mg/dL   Specific Gravity, Urine <=1.005 1.005 - 1.030   Hgb urine dipstick NEGATIVE NEGATIVE   pH 5.5 5.0 - 8.0   Protein, ur NEGATIVE NEGATIVE mg/dL   Urobilinogen, UA 0.2 0.0 - 1.0 mg/dL   Nitrite NEGATIVE NEGATIVE   Leukocytes,Ua NEGATIVE NEGATIVE  Pregnancy, urine POC     Status: None   Collection Time: 08/28/18  8:25 PM  Result Value Ref Range   Preg Test, Ur NEGATIVE NEGATIVE    ASSESSMENT & PLAN:  1. RLQ discomfort   2. Nausea without vomiting   3. Loose stools     Meds ordered this encounter  Medications  . dicyclomine (BENTYL) 20 MG tablet    Sig: Take 1 tablet (20 mg total) by mouth 2 (two) times daily.    Dispense:  20 tablet    Refill:   0    Order Specific Question:   Supervising Provider    Answer:   Raylene Everts [[id number]]  . ondansetron (ZOFRAN) 4 MG tablet    Sig: Take 1 tablet (4 mg total) by mouth every 6 (six) hours.    Dispense:  12 tablet    Refill:  0    Order Specific Question:   Supervising Provider    Answer:   Raylene Everts [[id number]]    Offered further evaluation and management in the ED for abdominal discomfort.  Patient declines at this time and would like to try outpatient therapy Urine did not show signs of infection Rest and push fluids Bentyl and zofran prescribed.  Take as directed If symptoms do not improve with medications within the next 24-48 hours please go to the ED or follow up with PCP for further evaluation and management If you experience new or worsening symptoms return or go to ER such as fever, chills, nausea, vomiting, diarrhea, bloody or dark tarry stools, constipation, urinary symptoms, worsening abdominal discomfort, 10/10 pain, symptoms that do not improve with medications, inability to keep fluids down, etc...  Reviewed expectations re: course of current medical issues. Questions answered. Outlined signs and symptoms indicating need for more acute intervention. Patient verbalized understanding. After Visit Summary given.   Lestine Box, PA-C 08/28/18 2050

## 2018-09-09 ENCOUNTER — Emergency Department (HOSPITAL_COMMUNITY): Payer: Self-pay

## 2018-09-09 ENCOUNTER — Emergency Department (HOSPITAL_COMMUNITY)
Admission: EM | Admit: 2018-09-09 | Discharge: 2018-09-09 | Disposition: A | Discharge status: Home / Self Care | Payer: Self-pay | Attending: Emergency Medicine | Admitting: Emergency Medicine

## 2018-09-09 ENCOUNTER — Other Ambulatory Visit: Payer: Self-pay

## 2018-09-09 DIAGNOSIS — R110 Nausea: Secondary | ICD-10-CM | POA: Insufficient documentation

## 2018-09-09 DIAGNOSIS — E039 Hypothyroidism, unspecified: Secondary | ICD-10-CM | POA: Insufficient documentation

## 2018-09-09 DIAGNOSIS — Z91040 Latex allergy status: Secondary | ICD-10-CM | POA: Insufficient documentation

## 2018-09-09 DIAGNOSIS — R197 Diarrhea, unspecified: Secondary | ICD-10-CM | POA: Insufficient documentation

## 2018-09-09 DIAGNOSIS — F17210 Nicotine dependence, cigarettes, uncomplicated: Secondary | ICD-10-CM | POA: Insufficient documentation

## 2018-09-09 DIAGNOSIS — Z79899 Other long term (current) drug therapy: Secondary | ICD-10-CM | POA: Insufficient documentation

## 2018-09-09 DIAGNOSIS — R1084 Generalized abdominal pain: Secondary | ICD-10-CM | POA: Insufficient documentation

## 2018-09-09 LAB — COMPREHENSIVE METABOLIC PANEL
ALT: 24 U/L (ref 0–44)
ANION GAP: 11 (ref 5–15)
AST: 37 U/L (ref 15–41)
Albumin: 3.6 g/dL (ref 3.5–5.0)
Alkaline Phosphatase: 56 U/L (ref 38–126)
BUN: <5 mg/dL — ABNORMAL LOW (ref 6–20)
CHLORIDE: 102 mmol/L (ref 98–111)
CO2: 25 mmol/L (ref 22–32)
Calcium: 9.5 mg/dL (ref 8.9–10.3)
Creatinine, Ser: 0.94 mg/dL (ref 0.44–1.00)
GFR calc Af Amer: >60 mL/min (ref >60)
GFR calc non Af Amer: >60 mL/min (ref >60)
Glucose, Bld: 111 mg/dL — ABNORMAL HIGH (ref 70–99)
Potassium: 3.6 mmol/L (ref 3.5–5.1)
Sodium: 138 mmol/L (ref 135–145)
Total Bilirubin: 0.7 mg/dL (ref 0.3–1.2)
Total Protein: 6.3 g/dL — ABNORMAL LOW (ref 6.5–8.1)

## 2018-09-09 LAB — I-STAT BETA HCG BLOOD, ED (MC, WL, AP ONLY): I-stat hCG, quantitative: <5 m[IU]/mL (ref <5)

## 2018-09-09 LAB — CBC WITH DIFFERENTIAL/PLATELET
Abs Immature Granulocytes: 0.02 10*3/uL (ref 0.00–0.07)
Basophils Absolute: 0 10*3/uL (ref 0.0–0.1)
Basophils Relative: 0 %
EOS ABS: 0.2 10*3/uL (ref 0.0–0.5)
EOS PCT: 2 %
HCT: 36.3 % (ref 36.0–46.0)
Hemoglobin: 11.6 g/dL — ABNORMAL LOW (ref 12.0–15.0)
Immature Granulocytes: 0 %
Lymphocytes Relative: 52 %
Lymphs Abs: 3.7 10*3/uL (ref 0.7–4.0)
MCH: 28.8 pg (ref 26.0–34.0)
MCHC: 32 g/dL (ref 30.0–36.0)
MCV: 90.1 fL (ref 80.0–100.0)
Monocytes Absolute: 0.9 10*3/uL (ref 0.1–1.0)
Monocytes Relative: 12 %
Neutro Abs: 2.5 10*3/uL (ref 1.7–7.7)
Neutrophils Relative %: 34 %
PLATELETS: 295 10*3/uL (ref 150–400)
RBC: 4.03 MIL/uL (ref 3.87–5.11)
RDW: 13.6 % (ref 11.5–15.5)
WBC: 7.3 10*3/uL (ref 4.0–10.5)
nRBC: 0 % (ref 0.0–0.2)

## 2018-09-09 LAB — URINALYSIS, ROUTINE W REFLEX MICROSCOPIC
Bilirubin Urine: NEGATIVE
Glucose, UA: NEGATIVE mg/dL
Hgb urine dipstick: NEGATIVE
Ketones, ur: NEGATIVE mg/dL
Leukocytes,Ua: NEGATIVE
NITRITE: NEGATIVE
Protein, ur: NEGATIVE mg/dL
Specific Gravity, Urine: 1.005 (ref 1.005–1.030)
pH: 6 (ref 5.0–8.0)

## 2018-09-09 LAB — LIPASE, BLOOD: Lipase: 33 U/L (ref 11–51)

## 2018-09-09 MED ORDER — MORPHINE SULFATE (PF) 4 MG/ML IV SOLN
4.0000 mg | Freq: Once | INTRAVENOUS | Status: AC
  Administered 2018-09-09: 4 mg via INTRAVENOUS
  Filled 2018-09-09: qty 1

## 2018-09-09 MED ORDER — ONDANSETRON HCL 4 MG/2ML IJ SOLN
4.0000 mg | Freq: Once | INTRAMUSCULAR | Status: AC
  Administered 2018-09-09: 4 mg via INTRAVENOUS
  Filled 2018-09-09: qty 2

## 2018-09-09 MED ORDER — ONDANSETRON 8 MG PO TBDP: 8.0000 mg | ORAL_TABLET | Freq: Three times a day (TID) | ORAL | 0 refills | Status: DC | PRN

## 2018-09-09 MED ORDER — SODIUM CHLORIDE 0.9 % IV BOLUS
1000.0000 mL | Freq: Once | INTRAVENOUS | Status: AC
  Administered 2018-09-09: 1000 mL via INTRAVENOUS

## 2018-09-09 MED ORDER — IOHEXOL 300 MG/ML  SOLN
100.0000 mL | Freq: Once | INTRAMUSCULAR | Status: AC | PRN
  Administered 2018-09-09: 100 mL via INTRAVENOUS

## 2018-09-09 NOTE — ED Provider Notes (Signed)
[facility] EMERGENCY DEPARTMENT Provider Note   CSN: [id number] Arrival date & time: 09/09/18  4034    History   Chief Complaint Chief Complaint  Patient presents with   Abdominal Pain   Diarrhea   Nausea    HPI Cindy Lamb is a 43 y.o. female.     Patient presents to the emergency department with a chief complaint of right-sided abdominal pain.  She reports having gradually worsening symptoms over the past 2 to 3 days.  She reports having a fever at home to 101 or 102.  She reports associated nausea, vomiting, and diarrhea.  She has tried Zofran and Bentyl with no relief.  Denies any dysuria or hematuria, but does complain of some suprapubic pain when she urinates.  Denies any blood in her vomit or stool, and states that her stool has been like a mucus and water.  The history is provided by the patient. No language interpreter was used.    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   Cervicogenic headache 12/13/2015    Past Surgical History:  Procedure Laterality Date   ABDOMINAL HYSTERECTOMY     APPLICATION OF CRANIAL NAVIGATION Right 03/22/2018   Procedure: APPLICATION OF CRANIAL NAVIGATION;  Surgeon: Consuella Lose, MD;  Location: [place];  Service: Neurosurgery;   Laterality: Right;  APPLICATION OF CRANIAL NAVIGATION   CRANIOTOMY N/A 02/01/2018   Procedure: CRANIOTOMY TRANSPHENOIDAL RESECTION OF PITUITARY TUMOR;  Surgeon: Consuella Lose, MD;  Location: [place];  Service: Neurosurgery;  Laterality: N/A;  CRANIOTOMY TRANSPHENOIDAL RESECTION OF PITUITARY TUMOR   CRANIOTOMY Right 03/22/2018   Procedure: STEREOTACTIC RIGHT PTERIONAL CRANIOTOMY, RESECTION OF CRANIOPHARYNGIOMA;  Surgeon: Consuella Lose, MD;  Location: [place];  Service: Neurosurgery;  Laterality: Right;   CYSTECTOMY  2006   DILATION AND CURETTAGE OF UTERUS     NASAL SEPTOPLASTY W/ TURBINOPLASTY N/A 02/01/2018   Procedure: POSSIBLE  NASAL SEPTOPLASTY WITH TURBINATE REDUCTION CONSIDER ENDOSCOPIC  EXCISION OF LEFT CONCHA BULLOSA AND REMOVAL OF LEFT MAXILLARY SINUS MUCOUS RETENTION CYST.;  Surgeon: Jerrell Belfast, MD;  Location: [place];  Service: ENT;  Laterality: N/A;   ORIF ANKLE FRACTURE BIMALLEOLAR  2010   PARTIAL HYSTERECTOMY  2006   TRANSPHENOIDAL APPROACH EXPOSURE N/A 02/01/2018   Procedure: TRANSPHENOIDAL APPROACH EXPOSURE WITH FUSION PROTOCOL;  Surgeon: Jerrell Belfast, MD;  Location: [place];  Service: ENT;  Laterality: N/A;   wisdom teeth removal       OB History   No obstetric history on file.      Home Medications    Prior to Admission medications   Medication Sig Start Date End Date Taking? Authorizing Provider  cetirizine (ZYRTEC) 10 MG tablet Take 10 mg by mouth  at bedtime.    [provider]  Cholecalciferol (VITAMIN D-3) 5000 units TABS Take 5,000 Units by mouth daily.    [provider]  desmopressin (DDAVP) 0.2 MG tablet Take 1 tablet (0.2 mg total) by mouth 2 (two) times daily. 06/14/18   Renato Shin, MD  dicyclomine (BENTYL) 20 MG tablet Take 1 tablet (20 mg total) by mouth 2 (two) times daily. 08/28/18   Wurst, [place], PA-C  famotidine (PEPCID) 20 MG tablet Take 20 mg by mouth daily.    [provider]  fluticasone (FLONASE) 50  MCG/ACT nasal spray Place 2 sprays into both nostrils 2 (two) times daily.     [provider]  gabapentin (NEURONTIN) 100 MG capsule Take 1 capsule (100 mg total) by mouth 3 (three) times daily. Patient taking differently: Take 300 mg by mouth 3 (three) times daily.  11/16/16   Ward Givens, NP  levothyroxine (SYNTHROID, LEVOTHROID) 75 MCG tablet Take 1 tablet (75 mcg total) by mouth daily. 05/14/18   Renato Shin, MD  Multiple Vitamin (MULTIVITAMIN) tablet Take 1 tablet by mouth daily.    [provider]  Naphazoline HCl (CLEAR EYES OP) Place 1 drop into both eyes daily as needed (dry/red eyes).    [provider]  ondansetron (ZOFRAN) 4 MG tablet Take 1 tablet (4 mg total) by mouth every 6 (six) hours. 08/28/18   Wurst, [place], PA-C  valACYclovir (VALTREX) 1000 MG tablet Take 1,000 mg by mouth daily.    [provider]    Family History Family History  Problem Relation Age of Onset   Prostate cancer Maternal Grandfather    Hypertension Maternal Grandfather    High Cholesterol Maternal Grandfather    Hypertension Maternal Grandmother    High Cholesterol Maternal Grandmother    Other Neg Hx        pituitary disorder    Social History Social History   Tobacco Use   Smoking status: Current Every Day Smoker    Packs/day: 0.25    Years: 18.00    Pack years: 4.50    Types: Cigarettes   Smokeless tobacco: Never Used  Substance Use Topics   Alcohol use: No    Alcohol/week: 0.0 standard drinks    Comment: 0-5 drinks per wk   Drug use: No    Comment: occ marijuana     Allergies   Latex   Review of Systems Review of Systems  All other systems reviewed and are negative.    Physical Exam Updated Vital Signs BP 116/89 (BP Location: Right Arm)    Pulse 88    Temp 98.9 F (37.2 C) (Oral)    Resp 17    Ht 5\' 7"  (1.702 m)    Wt 90.3 kg    SpO2 100%    BMI 31.17 kg/m   Physical Exam Vitals signs and nursing note reviewed.    Constitutional:      Appearance: She is well-developed.  HENT:     Head: Normocephalic and atraumatic.  Eyes:     Conjunctiva/sclera: Conjunctivae normal.     Pupils: Pupils are equal, round, and reactive to light.  Neck:     Musculoskeletal: Normal range of motion and neck supple.  Cardiovascular:     Rate and Rhythm: Normal rate and regular rhythm.     Heart sounds: No murmur. No friction rub. No gallop.   Pulmonary:     Effort: Pulmonary effort is normal. No respiratory distress.     Breath sounds: Normal  breath sounds. No wheezing or rales.  Chest:     Chest wall: No tenderness.  Abdominal:     General: Bowel sounds are normal. There is no distension.     Palpations: Abdomen is soft. There is no mass.     Tenderness: There is abdominal tenderness in the right lower quadrant. There is no guarding or rebound.  Musculoskeletal: Normal range of motion.        General: No tenderness.  Skin:    General: Skin is warm and dry.  Neurological:     Mental Status: She is alert and oriented to person, place, and time.  Psychiatric:        Behavior: Behavior normal.        Thought Content: Thought content normal.        Judgment: Judgment normal.      ED Treatments / Results  Labs (all labs ordered are listed, but only abnormal results are displayed) Labs Reviewed  CBC WITH DIFFERENTIAL/PLATELET - Abnormal; Notable for the following components:      Result Value   Hemoglobin 11.6 (*)    All other components within normal limits  COMPREHENSIVE METABOLIC PANEL - Abnormal; Notable for the following components:   Glucose, Bld 111 (*)    BUN <5 (*)    Total Protein 6.3 (*)    All other components within normal limits  LIPASE, BLOOD  URINALYSIS, ROUTINE W REFLEX MICROSCOPIC  I-STAT BETA HCG BLOOD, ED ([facility], [facility], [facility] ONLY)    EKG None  Radiology Ct Abdomen Pelvis W Contrast  Result Date: 09/09/2018 CLINICAL DATA:  Initial evaluation for acute right lower quadrant abdominal  pain. EXAM: CT ABDOMEN AND PELVIS WITH CONTRAST TECHNIQUE: Multidetector CT imaging of the abdomen and pelvis was performed using the standard protocol following bolus administration of intravenous contrast. CONTRAST:  158mL OMNIPAQUE IOHEXOL 300 MG/ML  SOLN COMPARISON:  None available. FINDINGS: Lower chest: Minimal subsegmental atelectasis seen at the lung bases. Visualized lungs are otherwise clear. Hepatobiliary: Subcentimeter hypodensity at the patent dome noted, too small the characterize, but of doubtful significance. Mild hepatic steatosis. Liver otherwise unremarkable. Gallbladder within normal limits. For NG and capped noted. No biliary dilatation. Pancreas: Pancreas within normal limits. Spleen: Spleen within normal limits. Adrenals/Urinary Tract: Adrenal glands are normal. Kidneys equal in size with symmetric enhancement. No nephrolithiasis, hydronephrosis, or focal enhancing renal mass. No hydroureter. Bladder partially distended without acute abnormality. Mild circumferential wall thickening about the bladder. Stomach/Bowel: Stomach within normal limits. No evidence for bowel obstruction. Normal appendix. Mild colonic diverticulosis without evidence for acute diverticulitis. No acute inflammatory changes seen about the bowels. Vascular/Lymphatic: Normal intravascular enhancement seen throughout the intra-abdominal aorta. Mild aorto bi-iliac atherosclerotic disease. No aneurysm. Mesenteric vessels patent proximally. No adenopathy. Reproductive: Uterus is absent.  Ovaries within normal limits. Other: No free air or fluid. Musculoskeletal: No acute osseous abnormality. No discrete lytic or blastic osseous lesions. Moderate spondylolysis noted at L5-S1. Middle sclerosis noted about the sacroiliac joints bilaterally. IMPRESSION: 1. Mild circumferential bladder wall thickening. While this finding may be related incomplete distension, possible acute cystitis could also have this appearance. Correlation with  urinalysis recommended. 2. No other acute intra-abdominal or pelvic process. 3. Normal appendix. 4. Colonic diverticulosis without evidence for acute diverticulitis. 5. Hepatic steatosis. Electronically Signed   By: Jeannine Boga M.D.   On: 09/09/2018 04:52    Procedures Procedures (including critical care time)  Medications Ordered in ED Medications  morphine 4 MG/ML injection 4 mg (has no  administration in time range)  ondansetron (ZOFRAN) injection 4 mg (has no administration in time range)  sodium chloride 0.9 % bolus 1,000 mL (has no administration in time range)     Initial Impression / Assessment and Plan / ED Course  I have reviewed the triage vital signs and the nursing notes.  Pertinent labs & imaging results that were available during my care of the patient were reviewed by me and considered in my medical decision making (see chart for details).        Patient with intermittent fevers and chills and right lower abdominal tenderness.  She is tender at McBurney's point on my exam.  Will check CT.  CT shows mild circumferential bladder wall thickening, which could indicate cystitis, but patient's urinalysis negative.  Could also be incomplete distention.  I have a low suspicion for acute cystitis.  CT shows normal appendix.  No other acute intra-abdominal or pelvic process is identified.  Patient does report a history of IBS on repeat evaluation, given the GI symptoms, I have encouraged her to follow-up with her GI doctor.  Return precautions given.  She is stable and ready for discharge.   Final Clinical Impressions(s) / ED Diagnoses   Final diagnoses:  Nausea  Diarrhea, unspecified type  Generalized abdominal pain    ED Discharge Orders         Ordered    ondansetron (ZOFRAN ODT) 8 MG disintegrating tablet  Every 8 hours PRN     09/09/18 0508           Montine Circle, PA-C 09/09/18 0510    Orpah Greek, MD 09/09/18 [phone]

## 2018-09-09 NOTE — ED Notes (Signed)
Pt returned to room  

## 2018-09-09 NOTE — ED Triage Notes (Signed)
Pt presents to ED with abd pain in RUQ & RLQ, bloating, n/d, dry heaving x2w. Per pt bowel movement has mucus appearance. See at [facility], prescribed Zofran and antibiotic, no relief. 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 On assessment pain is on LUQ and LLQ.

## 2018-09-09 NOTE — ED Notes (Signed)
Patient transported to CT 

## 2018-09-09 NOTE — ED Notes (Signed)
ED Provider at bedside. 

## 2018-09-09 NOTE — ED Notes (Signed)
Pt discharged from facility. Ambulating well. Pt verbalized understanding of discharge instructions.

## 2018-11-06 ENCOUNTER — Other Ambulatory Visit: Payer: Self-pay | Admitting: Endocrinology

## 2018-11-06 NOTE — Telephone Encounter (Signed)
Please advise if refill is appropriate 

## 2018-11-07 NOTE — Telephone Encounter (Signed)
Please refill x 1 F/u is due  

## 2018-12-21 ENCOUNTER — Other Ambulatory Visit: Payer: Self-pay | Admitting: Endocrinology

## 2018-12-21 NOTE — Telephone Encounter (Signed)
Please refill x 1 F/u is due  

## 2018-12-30 ENCOUNTER — Other Ambulatory Visit: Payer: Self-pay

## 2019-01-01 ENCOUNTER — Ambulatory Visit: Payer: Self-pay | Admitting: Endocrinology

## 2019-01-31 ENCOUNTER — Other Ambulatory Visit: Payer: Self-pay | Admitting: Endocrinology

## 2020-01-15 ENCOUNTER — Ambulatory Visit (HOSPITAL_COMMUNITY)
Admission: EM | Admit: 2020-01-15 | Discharge: 2020-01-15 | Disposition: A | Discharge status: Home / Self Care | Payer: Managed Care, Other (non HMO) | Attending: Internal Medicine | Admitting: Internal Medicine

## 2020-01-15 ENCOUNTER — Encounter (HOSPITAL_COMMUNITY): Payer: Self-pay

## 2020-01-15 ENCOUNTER — Other Ambulatory Visit: Payer: Self-pay

## 2020-01-15 DIAGNOSIS — E039 Hypothyroidism, unspecified: Secondary | ICD-10-CM | POA: Insufficient documentation

## 2020-01-15 DIAGNOSIS — Z8349 Family history of other endocrine, nutritional and metabolic diseases: Secondary | ICD-10-CM | POA: Insufficient documentation

## 2020-01-15 DIAGNOSIS — Z79890 Hormone replacement therapy (postmenopausal): Secondary | ICD-10-CM | POA: Diagnosis not present

## 2020-01-15 DIAGNOSIS — R42 Dizziness and giddiness: Secondary | ICD-10-CM | POA: Diagnosis present

## 2020-01-15 DIAGNOSIS — Z8249 Family history of ischemic heart disease and other diseases of the circulatory system: Secondary | ICD-10-CM | POA: Diagnosis not present

## 2020-01-15 DIAGNOSIS — E7800 Pure hypercholesterolemia, unspecified: Secondary | ICD-10-CM | POA: Diagnosis not present

## 2020-01-15 DIAGNOSIS — I951 Orthostatic hypotension: Secondary | ICD-10-CM | POA: Diagnosis not present

## 2020-01-15 DIAGNOSIS — R110 Nausea: Secondary | ICD-10-CM | POA: Diagnosis not present

## 2020-01-15 DIAGNOSIS — E230 Hypopituitarism: Secondary | ICD-10-CM | POA: Diagnosis not present

## 2020-01-15 DIAGNOSIS — Z79899 Other long term (current) drug therapy: Secondary | ICD-10-CM | POA: Diagnosis not present

## 2020-01-15 DIAGNOSIS — K219 Gastro-esophageal reflux disease without esophagitis: Secondary | ICD-10-CM | POA: Insufficient documentation

## 2020-01-15 DIAGNOSIS — F17210 Nicotine dependence, cigarettes, uncomplicated: Secondary | ICD-10-CM | POA: Diagnosis not present

## 2020-01-15 DIAGNOSIS — R5383 Other fatigue: Secondary | ICD-10-CM | POA: Insufficient documentation

## 2020-01-15 LAB — COMPREHENSIVE METABOLIC PANEL
ALT: 14 U/L (ref 0–44)
AST: 22 U/L (ref 15–41)
Albumin: 3.8 g/dL (ref 3.5–5.0)
Alkaline Phosphatase: 61 U/L (ref 38–126)
Anion gap: 8 (ref 5–15)
BUN: <5 mg/dL — ABNORMAL LOW (ref 6–20)
CO2: 28 mmol/L (ref 22–32)
Calcium: 10.2 mg/dL (ref 8.9–10.3)
Chloride: 103 mmol/L (ref 98–111)
Creatinine, Ser: 0.8 mg/dL (ref 0.44–1.00)
GFR calc Af Amer: >60 mL/min (ref >60)
GFR calc non Af Amer: >60 mL/min (ref >60)
Glucose, Bld: 94 mg/dL (ref 70–99)
Potassium: 3.9 mmol/L (ref 3.5–5.1)
Sodium: 139 mmol/L (ref 135–145)
Total Bilirubin: 0.7 mg/dL (ref 0.3–1.2)
Total Protein: 6.8 g/dL (ref 6.5–8.1)

## 2020-01-15 LAB — CBC WITH DIFFERENTIAL/PLATELET
Abs Immature Granulocytes: 0 10*3/uL (ref 0.00–0.07)
Basophils Absolute: 0 10*3/uL (ref 0.0–0.1)
Basophils Relative: 0 %
Eosinophils Absolute: 0.1 10*3/uL (ref 0.0–0.5)
Eosinophils Relative: 2 %
HCT: 34.4 % — ABNORMAL LOW (ref 36.0–46.0)
Hemoglobin: 11 g/dL — ABNORMAL LOW (ref 12.0–15.0)
Immature Granulocytes: 0 %
Lymphocytes Relative: 66 %
Lymphs Abs: 3.6 10*3/uL (ref 0.7–4.0)
MCH: 28.6 pg (ref 26.0–34.0)
MCHC: 32 g/dL (ref 30.0–36.0)
MCV: 89.6 fL (ref 80.0–100.0)
Monocytes Absolute: 0.5 10*3/uL (ref 0.1–1.0)
Monocytes Relative: 8 %
Neutro Abs: 1.4 10*3/uL — ABNORMAL LOW (ref 1.7–7.7)
Neutrophils Relative %: 24 %
Platelets: 303 10*3/uL (ref 150–400)
RBC: 3.84 MIL/uL — ABNORMAL LOW (ref 3.87–5.11)
RDW: 12.2 % (ref 11.5–15.5)
WBC: 5.6 10*3/uL (ref 4.0–10.5)
nRBC: 0 % (ref 0.0–0.2)

## 2020-01-15 LAB — VITAMIN D 25 HYDROXY (VIT D DEFICIENCY, FRACTURES): Vit D, 25-Hydroxy: 52.14 ng/mL (ref 30–100)

## 2020-01-15 LAB — TSH: TSH: 2.573 u[IU]/mL (ref 0.350–4.500)

## 2020-01-15 MED ORDER — ONDANSETRON 4 MG PO TBDP: 4.0000 mg | ORAL_TABLET | Freq: Three times a day (TID) | ORAL | 0 refills | Status: DC | PRN

## 2020-01-15 MED ORDER — LEVOTHYROXINE SODIUM 50 MCG PO TABS: 50.0000 ug | ORAL_TABLET | Freq: Every day | ORAL | 2 refills | Status: DC

## 2020-01-15 MED ORDER — DESMOPRESSIN ACETATE 0.2 MG PO TABS: 200.0000 ug | ORAL_TABLET | Freq: Two times a day (BID) | ORAL | 0 refills | Status: DC

## 2020-01-15 NOTE — Discharge Instructions (Addendum)
Patient is advised to get up from a laying or sitting position in a graded fashion If you feel dizzy when he initiates the moving around please stop and sit down If dizziness, lightheadedness persist please return to urgent care to be reevaluated.

## 2020-01-15 NOTE — ED Provider Notes (Signed)
[facility]    CSN: [id number] Arrival date & time: 01/15/20  1717      History   Chief Complaint Chief Complaint  Patient presents with  . Dizziness  . Nausea    HPI Cindy Lamb is a 44 y.o. female with a history of hypothyroidism and on desmopressin without the diagnosis for diabetes insipidus comes to urgent care with generalized weakness, dizziness which has been ongoing for the past week.  Patient had severe episode this morning and at that time she took her blood pressure which showed systolic of 259 mmHg.  Patient has been without her routine medications desmopressin and levothyroxine since April because she did not have any insurance.  She recently got a job and got insurance and is planning on establishing PCP care.  Patient denies any fever or chills.  No nausea vomiting.  Patient denies any syncopal episodes.  She feels fatigued and chronically tired.  She denies any cold intolerance.   HPI  Past Medical History:  Diagnosis Date  . Anemia   . Anxiety   . 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 Dyspnea    not normally  . Dysrhythmia    palpitations occasionally with chest pain (stabbing pain)   PAC's  . GERD (gastroesophageal reflux disease)   . High cholesterol   . Hypothyroidism   . IBS (irritable bowel syndrome)   . Low back pain    2 bulging discs  . Migraines   . Spinal stenosis    Mild    Patient Active Problem List   Diagnosis Date Noted  . Myalgia 05/14/2018  . Status post craniotomy 03/22/2018  . 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 Deviated septum 02/01/2018  . 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 Cervicogenic headache 12/13/2015    Past Surgical History:  Procedure Laterality Date  . ABDOMINAL HYSTERECTOMY    . APPLICATION OF CRANIAL NAVIGATION Right 03/22/2018   Procedure: APPLICATION OF CRANIAL NAVIGATION;  Surgeon: Consuella Lose, MD;   Location: [facility];  Service: Neurosurgery;  Laterality: Right;  APPLICATION OF CRANIAL NAVIGATION  . CRANIOTOMY N/A 02/01/2018   Procedure: CRANIOTOMY TRANSPHENOIDAL RESECTION OF PITUITARY TUMOR;  Surgeon: Consuella Lose, MD;  Location: [facility];  Service: Neurosurgery;  Laterality: N/A;  CRANIOTOMY TRANSPHENOIDAL RESECTION OF PITUITARY TUMOR  . CRANIOTOMY Right 03/22/2018   Procedure: STEREOTACTIC RIGHT PTERIONAL CRANIOTOMY, RESECTION OF CRANIOPHARYNGIOMA;  Surgeon: Consuella Lose, MD;  Location: [facility];  Service: Neurosurgery;  Laterality: Right;  . CYSTECTOMY  2006  . DILATION AND CURETTAGE OF UTERUS    . NASAL SEPTOPLASTY W/ TURBINOPLASTY N/A 02/01/2018   Procedure: POSSIBLE  NASAL SEPTOPLASTY WITH TURBINATE REDUCTION CONSIDER ENDOSCOPIC  EXCISION OF LEFT CONCHA BULLOSA AND REMOVAL OF LEFT MAXILLARY SINUS MUCOUS RETENTION CYST.;  Surgeon: Jerrell Belfast, MD;  Location: [place];  Service: ENT;  Laterality: N/A;  . ORIF ANKLE FRACTURE BIMALLEOLAR  2010  . PARTIAL HYSTERECTOMY  2006  . TRANSPHENOIDAL APPROACH EXPOSURE N/A 02/01/2018   Procedure: TRANSPHENOIDAL APPROACH EXPOSURE WITH FUSION PROTOCOL;  Surgeon: Jerrell Belfast, MD;  Location: [place];  Service: ENT;  Laterality: N/A;  . wisdom teeth removal      OB History   No obstetric history on file.      Home Medications    Prior to Admission medications   Medication Sig Start Date End Date Taking? Authorizing Provider  cetirizine (ZYRTEC) 10 MG tablet Take 10 mg by mouth  daily as needed for allergies.     [provider]  Cholecalciferol (VITAMIN D-3) 5000 units TABS Take 5,000 Units by mouth daily.    [provider]  desmopressin (DDAVP) 0.2 MG tablet Take 1 tablet (200 mcg total) by mouth 2 (two) times daily. 01/15/20   Evynn Boutelle, Myrene Galas, MD  dicyclomine (BENTYL) 20 MG tablet Take 1 tablet (20 mg total) by mouth 2 (two) times daily. 08/28/18   Wurst, [place], PA-C  esomeprazole (NEXIUM) 20 MG capsule Take 20 mg by  mouth daily at 12 noon.    [provider]  famotidine (PEPCID) 20 MG tablet Take 20 mg by mouth daily.    [provider]  fluticasone (FLONASE) 50 MCG/ACT nasal spray Place 2 sprays into both nostrils 2 (two) times daily.     [provider]  gabapentin (NEURONTIN) 100 MG capsule Take 1 capsule (100 mg total) by mouth 3 (three) times daily. Patient taking differently: Take 300 mg by mouth 3 (three) times daily.  11/16/16   Ward Givens, NP  levothyroxine (SYNTHROID) 50 MCG tablet Take 1 tablet (50 mcg total) by mouth daily. 01/15/20   Chase Picket, MD  Multiple Vitamin (MULTIVITAMIN) tablet Take 1 tablet by mouth daily.    [provider]  ondansetron (ZOFRAN ODT) 4 MG disintegrating tablet Take 1 tablet (4 mg total) by mouth every 8 (eight) hours as needed for nausea or vomiting. 01/15/20   Bayden Gil, Myrene Galas, MD  valACYclovir (VALTREX) 1000 MG tablet Take 1,000 mg by mouth daily as needed (for breakouts).     [provider]    Family History Family History  Problem Relation Age of Onset  . Prostate cancer Maternal Grandfather   . Hypertension Maternal Grandfather   . High Cholesterol Maternal Grandfather   . Hypertension Maternal Grandmother   . High Cholesterol Maternal Grandmother   . Other Neg Hx        pituitary disorder    Social History Social History   Tobacco Use  . Smoking status: Current Every Day Smoker    Packs/day: 0.25    Years: 18.00    Pack years: 4.50    Types: Cigarettes  . Smokeless tobacco: Never Used  Vaping Use  . Vaping Use: Never used  Substance Use Topics  . Alcohol use: No    Alcohol/week: 0.0 standard drinks    Comment: 0-5 drinks per wk  . Drug use: No    Comment: occ marijuana     Allergies   Latex   Review of Systems Review of Systems  Constitutional: Positive for activity change, appetite change and fatigue. Negative for unexpected weight change.  HENT: Negative.   Eyes: Negative.    Gastrointestinal: Negative.   Endocrine: Positive for polydipsia and polyuria. Negative for cold intolerance and heat intolerance.  Genitourinary: Negative.   Neurological: Positive for dizziness, weakness, light-headedness and headaches.  Psychiatric/Behavioral: Negative for confusion and decreased concentration.     Physical Exam Triage Vital Signs ED Triage Vitals  Enc Vitals Group     BP 01/15/20 1752 100/76     Pulse Rate 01/15/20 1752 86     Resp 01/15/20 1752 17     Temp 01/15/20 1752 98.9 F (37.2 C)     Temp Source 01/15/20 1752 Oral     SpO2 01/15/20 1752 99 %     Weight --      Height --      Head Circumference --  Peak Flow --      Pain Score 01/15/20 1750 2     Pain Loc --      Pain Edu? --      Excl. in [place]? --    No data found.  Updated Vital Signs BP 100/76 (BP Location: Right Arm)   Pulse 86   Temp 98.9 F (37.2 C) (Oral)   Resp 17   SpO2 99%   Visual Acuity Right Eye Distance:   Left Eye Distance:   Bilateral Distance:    Right Eye Near:   Left Eye Near:    Bilateral Near:     Physical Exam Vitals and nursing note reviewed.  Constitutional:      General: She is not in acute distress.    Appearance: She is not ill-appearing.  HENT:     Right Ear: Tympanic membrane normal.     Left Ear: Tympanic membrane normal.  Cardiovascular:     Rate and Rhythm: Normal rate and regular rhythm.     Pulses: Normal pulses.     Heart sounds: Normal heart sounds.  Pulmonary:     Effort: Pulmonary effort is normal. No respiratory distress.     Breath sounds: Normal breath sounds. No wheezing or rhonchi.  Abdominal:     General: Bowel sounds are normal.     Palpations: Abdomen is soft.  Skin:    General: Skin is warm.     Capillary Refill: Capillary refill takes less than 2 seconds.  Neurological:     General: No focal deficit present.     Mental Status: She is alert and oriented to person, place, and time.     Cranial Nerves: No cranial nerve  deficit.     Sensory: No sensory deficit.  Psychiatric:        Mood and Affect: Mood normal.        Behavior: Behavior normal.      UC Treatments / Results  Labs (all labs ordered are listed, but only abnormal results are displayed) Labs Reviewed  CBC WITH DIFFERENTIAL/PLATELET  COMPREHENSIVE METABOLIC PANEL  TSH  VITAMIN D 25 HYDROXY (VIT D DEFICIENCY, FRACTURES)    EKG   Radiology No results found.  Procedures Procedures (including critical care time)  Medications Ordered in UC Medications - No data to display  Initial Impression / Assessment and Plan / UC Course  I have reviewed the triage vital signs and the nursing notes.  Pertinent labs & imaging results that were available during my care of the patient were reviewed by me and considered in my medical decision making (see chart for details).    1.  Orthostasis CBC, CMP, TSH, vitamin D level Refill levothyroxine 50 mcg daily, and desmopressin 200 mcg daily Zofran ODT 4 mg as needed for nausea/vomiting. Orthostatic precautions given If patient symptoms worsens patient is advised to return to urgent care to be reevaluated Patient verbalized understanding of the discharge instructions.   Final Clinical Impressions(s) / UC Diagnoses   Final diagnoses:  Orthostasis     Discharge Instructions     Patient is advised to get up from a laying or sitting position in a graded fashion If you feel dizzy when he initiates the moving around please stop and sit down If dizziness, lightheadedness persist please return to urgent care to be reevaluated.   ED Prescriptions    Medication Sig Dispense Auth. Provider   levothyroxine (SYNTHROID) 50 MCG tablet Take 1 tablet (50 mcg total) by mouth daily. [place]  tablet Tessy Pawelski, Myrene Galas, MD   desmopressin (DDAVP) 0.2 MG tablet Take 1 tablet (200 mcg total) by mouth 2 (two) times daily. 60 tablet Faron Whitelock, Myrene Galas, MD   ondansetron (ZOFRAN ODT) 4 MG disintegrating tablet Take 1  tablet (4 mg total) by mouth every 8 (eight) hours as needed for nausea or vomiting. 20 tablet Claudene Gatliff, Myrene Galas, MD     PDMP not reviewed this encounter.   Chase Picket, MD 01/15/20 [phone]

## 2020-01-15 NOTE — ED Triage Notes (Signed)
Pt states she has not had a PCP so she has not been on her regular medication that she is supposed to since April which she believes is associated with her not feeling well.

## 2020-01-15 NOTE — ED Triage Notes (Signed)
Pt presents with dizziness and nausea X 1 week from unknown source.

## 2020-02-18 ENCOUNTER — Ambulatory Visit: Payer: Self-pay | Admitting: Endocrinology

## 2020-07-12 ENCOUNTER — Other Ambulatory Visit: Payer: Self-pay

## 2020-07-12 ENCOUNTER — Ambulatory Visit: Payer: 59 | Admitting: Endocrinology

## 2020-07-12 ENCOUNTER — Telehealth: Payer: Self-pay | Admitting: Endocrinology

## 2020-07-12 VITALS — BP 118/80 | HR 71 | Ht 67.5 in | Wt 154.8 lb

## 2020-07-12 DIAGNOSIS — D352 Benign neoplasm of pituitary gland: Secondary | ICD-10-CM

## 2020-07-12 NOTE — Patient Instructions (Addendum)
blood and urine tests are requested for you today.  We'll let you know about the results. Let's recheck the CT scan.  you will receive a phone call, about a day and time for an appointment.   Please come back for a follow-up appointment in 6 weeks.

## 2020-07-12 NOTE — Telephone Encounter (Signed)
Please see below.

## 2020-07-12 NOTE — Progress Notes (Addendum)
Subjective:    Patient ID: Cindy [facility], female    DOB: May 02, 1976, 45 y.o.   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   She has these pit functions:  Prolactin: elevated, with plan for rx when thyroid is better.   ACTH: needs prednisone FSH/LH: low (she has had TAH but not BSO) GH: normal TSH: needs synthroid VP: needs d-DAVP  She has regained her insurance.  She reports nausea polyuria, and headache.  She has nocturia 2-3 times per night.  She ran out of synthroid 2 weeks ago.  She has been off d-DAVP and prednisone, x a few months.   Past Medical History:  Diagnosis Date  . Anemia   . Anxiety   . 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 Dyspnea    not normally  . Dysrhythmia    palpitations occasionally with chest pain (stabbing pain)   PAC's  . GERD (gastroesophageal reflux disease)   . High cholesterol   . Hypothyroidism   . IBS (irritable bowel syndrome)   . Low back pain    2 bulging discs  . Migraines   . Spinal stenosis    Mild    Past Surgical History:  Procedure Laterality Date  . ABDOMINAL HYSTERECTOMY    . APPLICATION OF CRANIAL NAVIGATION Right 03/22/2018   Procedure: APPLICATION OF CRANIAL NAVIGATION;  Surgeon: Lisbeth Renshaw, MD;  Location: [facility] OR;  Service: Neurosurgery;  Laterality: Right;  APPLICATION OF CRANIAL NAVIGATION  . CRANIOTOMY N/A 02/01/2018   Procedure: CRANIOTOMY TRANSPHENOIDAL RESECTION OF PITUITARY TUMOR;  Surgeon: Lisbeth Renshaw, MD;  Location: [facility] OR;  Service: Neurosurgery;  Laterality: N/A;  CRANIOTOMY TRANSPHENOIDAL RESECTION OF PITUITARY TUMOR  . CRANIOTOMY Right 03/22/2018   Procedure: STEREOTACTIC RIGHT PTERIONAL CRANIOTOMY, RESECTION OF CRANIOPHARYNGIOMA;  Surgeon: Lisbeth Renshaw, MD;  Location: [facility] OR;  Service: Neurosurgery;  Laterality: Right;  . CYSTECTOMY  2006  . DILATION AND CURETTAGE OF UTERUS    . NASAL  SEPTOPLASTY W/ TURBINOPLASTY N/A 02/01/2018   Procedure: POSSIBLE  NASAL SEPTOPLASTY WITH TURBINATE REDUCTION CONSIDER ENDOSCOPIC  EXCISION OF LEFT CONCHA BULLOSA AND REMOVAL OF LEFT MAXILLARY SINUS MUCOUS RETENTION CYST.;  Surgeon: Osborn Coho, MD;  Location: [facility] OR;  Service: ENT;  Laterality: N/A;  . ORIF ANKLE FRACTURE BIMALLEOLAR  2010  . PARTIAL HYSTERECTOMY  2006  . TRANSPHENOIDAL APPROACH EXPOSURE N/A 02/01/2018   Procedure: TRANSPHENOIDAL APPROACH EXPOSURE WITH FUSION PROTOCOL;  Surgeon: Osborn Coho, MD;  Location: [facility] OR;  Service: ENT;  Laterality: N/A;  . wisdom teeth removal      Social History   Socioeconomic History  . Marital status: Single    Spouse name: Not on file  . Number of children: 2  . Years of education: College  . Highest education level: Not on file  Occupational History    Comment: Partnership for [employer]  Tobacco Use  . Smoking status: Current Every Day Smoker    Packs/day: 0.25    Years: 18.00    Pack years: 4.50    Types: Cigarettes  . Smokeless tobacco: Never Used  Vaping Use  . Vaping Use: Never used  Substance and Sexual Activity  . Alcohol use: No    Alcohol/week: 0.0 standard drinks    Comment: 0-5 drinks per wk  . Drug use: No    Comment: occ marijuana  . Sexual activity: Not on file  Other Topics Concern  .  Not on file  Social History Narrative   Lives at home w/ her son   Right-handed   Occasional caffeine   Social Determinants of Health   Financial Resource Strain: Not on file  Food Insecurity: Not on file  Transportation Needs: Not on file  Physical Activity: Not on file  Stress: Not on file  Social Connections: Not on file  Intimate Partner Violence: Not on file    Current Outpatient Medications on File Prior to Visit  Medication Sig Dispense Refill  . cetirizine (ZYRTEC) 10 MG tablet Take 10 mg by mouth daily as needed for allergies.     . Cholecalciferol (VITAMIN D-3) 5000 units TABS Take 5,000 Units by  mouth daily.    Marland Kitchen dicyclomine (BENTYL) 20 MG tablet Take 1 tablet (20 mg total) by mouth 2 (two) times daily. 20 tablet 0  . esomeprazole (NEXIUM) 20 MG capsule Take 20 mg by mouth daily at 12 noon.    . famotidine (PEPCID) 20 MG tablet Take 20 mg by mouth daily.    . fluticasone (FLONASE) 50 MCG/ACT nasal spray Place 2 sprays into both nostrils 2 (two) times daily.    Marland Kitchen gabapentin (NEURONTIN) 100 MG capsule Take 1 capsule (100 mg total) by mouth 3 (three) times daily. (Patient taking differently: Take 300 mg by mouth 3 (three) times daily.) 60 capsule 5  . Multiple Vitamin (MULTIVITAMIN) tablet Take 1 tablet by mouth daily.    . ondansetron (ZOFRAN ODT) 4 MG disintegrating tablet Take 1 tablet (4 mg total) by mouth every 8 (eight) hours as needed for nausea or vomiting. 20 tablet 0  . valACYclovir (VALTREX) 1000 MG tablet Take 1,000 mg by mouth daily as needed (for breakouts).      No current facility-administered medications on file prior to visit.    Allergies  Allergen Reactions  . Latex Rash    Family History  Problem Relation Age of Onset  . Prostate cancer Maternal Grandfather   . Hypertension Maternal Grandfather   . High Cholesterol Maternal Grandfather   . Hypertension Maternal Grandmother   . High Cholesterol Maternal Grandmother   . Other Neg Hx        pituitary disorder    BP 118/80 (BP Location: Right Arm, Patient Position: Sitting, Cuff Size: Normal)   Pulse 71   Ht 5' 7.5" (1.715 m)   Wt 154 lb 12.8 oz (70.2 kg)   SpO2 99%   BMI 23.89 kg/m    Review of Systems Denies visual loss.   She has lost 30 lbs x a few months (unintentional).      Objective:   Physical Exam VITAL SIGNS:  See vs page GENERAL: no distress EXT: no leg edema GAIT: normal and steady.        Assessment & Plan:  Pituitary cyst, due for f/u.  Pit insuff: off meds.  We'll check labs today.   Patient Instructions  blood and urine tests are requested for you today.  We'll let you  know about the results. Let's recheck the CT scan.  you will receive a phone call, about a day and time for an appointment.   Please come back for a follow-up appointment in 6 weeks.

## 2020-07-12 NOTE — Telephone Encounter (Signed)
Noted,  Thank you!

## 2020-07-12 NOTE — Telephone Encounter (Signed)
done

## 2020-07-12 NOTE — Telephone Encounter (Signed)
[facility] called regarding a order for pt. They stated they needed the orders to be changed to either CT head without OR CT head with and without. They stated they don't do CT head with orders.

## 2020-07-13 LAB — CORTISOL: Cortisol, Plasma: 0.2 ug/dL

## 2020-07-13 LAB — URINALYSIS, ROUTINE W REFLEX MICROSCOPIC
Bilirubin Urine: NEGATIVE
Hgb urine dipstick: NEGATIVE
Ketones, ur: NEGATIVE
Leukocytes,Ua: NEGATIVE
Nitrite: NEGATIVE
RBC / HPF: NONE SEEN (ref 0–?)
Specific Gravity, Urine: <=1.005 — AB (ref 1.000–1.030)
Total Protein, Urine: NEGATIVE
Urine Glucose: NEGATIVE
Urobilinogen, UA: 0.2 (ref 0.0–1.0)
WBC, UA: NONE SEEN (ref 0–?)
pH: 6 (ref 5.0–8.0)

## 2020-07-13 LAB — TSH: TSH: 3.03 u[IU]/mL (ref 0.35–4.50)

## 2020-07-13 LAB — BASIC METABOLIC PANEL
BUN: 4 mg/dL — ABNORMAL LOW (ref 6–23)
CO2: 30 mEq/L (ref 19–32)
Calcium: 9.8 mg/dL (ref 8.4–10.5)
Chloride: 102 mEq/L (ref 96–112)
Creatinine, Ser: 0.79 mg/dL (ref 0.40–1.20)
GFR: 91.96 mL/min (ref >60.00)
Glucose, Bld: 76 mg/dL (ref 70–99)
Potassium: 3.9 mEq/L (ref 3.5–5.1)
Sodium: 138 mEq/L (ref 135–145)

## 2020-07-13 LAB — T4, FREE: Free T4: 0.37 ng/dL — ABNORMAL LOW (ref 0.60–1.60)

## 2020-07-13 LAB — FOLLICLE STIMULATING HORMONE: FSH: 1.5 m[IU]/mL

## 2020-07-13 LAB — LUTEINIZING HORMONE: LH: 0.06 m[IU]/mL

## 2020-07-13 LAB — ARGININE VASOPRESSIN HORMONE

## 2020-07-15 LAB — PROLACTIN: Prolactin: 75.3 ng/mL — ABNORMAL HIGH

## 2020-07-15 LAB — ACTH: C206 ACTH: 18 pg/mL (ref 6–50)

## 2020-07-16 ENCOUNTER — Other Ambulatory Visit: Payer: Self-pay | Admitting: Endocrinology

## 2020-07-16 ENCOUNTER — Encounter: Payer: Self-pay | Admitting: Endocrinology

## 2020-07-16 DIAGNOSIS — D352 Benign neoplasm of pituitary gland: Secondary | ICD-10-CM

## 2020-07-16 LAB — ARGININE VASOPRESSIN HORMONE: Osmolality Meas: 285 mOsmol/kg (ref 275–295)

## 2020-07-16 MED ORDER — LEVOTHYROXINE SODIUM 50 MCG PO TABS
50.0000 ug | ORAL_TABLET | Freq: Every day | ORAL | 3 refills | Status: DC
Start: 2020-07-16 — End: 2021-02-26

## 2020-07-16 MED ORDER — PREDNISONE 5 MG PO TABS: 5.0000 mg | ORAL_TABLET | Freq: Every day | ORAL | 3 refills | Status: DC

## 2020-07-16 MED ORDER — DESMOPRESSIN ACETATE 0.1 MG PO TABS
0.1000 mg | ORAL_TABLET | Freq: Two times a day (BID) | ORAL | 1 refills | Status: DC
Start: 2020-07-16 — End: 2021-02-01

## 2020-07-16 MED ORDER — ONDANSETRON 4 MG PO TBDP: 4.0000 mg | ORAL_TABLET | Freq: Three times a day (TID) | ORAL | 0 refills | Status: DC | PRN

## 2020-07-21 ENCOUNTER — Telehealth: Payer: Self-pay | Admitting: Endocrinology

## 2020-07-21 NOTE — Telephone Encounter (Signed)
-----   Message from Lori D Bowers sent at 07/20/2020  2:28 PM EST ----- Pt's insurance denied her CT scan - they state that it would not provide enough details that you are looking for and an MRI would be approved.   Please advise.  Lori  

## 2020-07-21 NOTE — Telephone Encounter (Signed)
-----   Message from Cindy Lamb sent at 07/20/2020  2:28 PM EST ----- Pt's insurance denied her CT scan - they state that it would not provide enough details that you are looking for and an MRI would be approved.   Please advise.  Cecille Rubin

## 2020-07-21 NOTE — Telephone Encounter (Signed)
Please resubmit CT request with that info

## 2020-07-21 NOTE — Telephone Encounter (Signed)
Please foreward info to [facility]

## 2020-07-21 NOTE — Telephone Encounter (Signed)
Pt stated have metal plate head and Right ankle.

## 2020-07-21 NOTE — Telephone Encounter (Signed)
please contact patient: Please remind [place]: do you have metal in your body (asking about MRI)?

## 2020-07-21 NOTE — Telephone Encounter (Signed)
Called [facility]--[phone] --stated is ok to have metal in pt body for CT but not for MRI. Tried request documentation  by DrMarland Kitchen Loanne Drilling regarding CT-metal ok for CT scan for this particular pt. But unable to create documentation due to not set up as doctor office by [facility] staff.

## 2020-07-22 NOTE — Telephone Encounter (Signed)
Contacted insurance - resubmitted case for reconsideration due to patient having metal plates

## 2020-07-22 NOTE — Telephone Encounter (Signed)
Notified Cecille Rubin (PCC)--pt have metal plate head and right ankle and Lori stated--will contact the insurance with this information.

## 2020-07-28 ENCOUNTER — Ambulatory Visit
Admission: RE | Admit: 2020-07-28 | Discharge: 2020-07-28 | Disposition: A | Discharge status: Home / Self Care | Payer: 59 | Source: Ambulatory Visit | Attending: Endocrinology | Admitting: Endocrinology

## 2020-07-28 DIAGNOSIS — D352 Benign neoplasm of pituitary gland: Secondary | ICD-10-CM

## 2020-07-28 MED ORDER — IOPAMIDOL (ISOVUE-300) INJECTION 61%
100.0000 mL | Freq: Once | INTRAVENOUS | Status: AC | PRN
  Administered 2020-07-28: 75 mL via INTRAVENOUS

## 2020-08-25 ENCOUNTER — Ambulatory Visit: Payer: 59 | Admitting: Endocrinology

## 2020-12-17 ENCOUNTER — Other Ambulatory Visit: Payer: Self-pay | Admitting: Neurosurgery

## 2020-12-17 DIAGNOSIS — G930 Cerebral cysts: Secondary | ICD-10-CM

## 2020-12-22 ENCOUNTER — Other Ambulatory Visit: Payer: Self-pay | Admitting: Gastroenterology

## 2020-12-22 ENCOUNTER — Ambulatory Visit: Payer: Managed Care, Other (non HMO) | Admitting: Podiatrist

## 2020-12-22 DIAGNOSIS — R140 Abdominal distension (gaseous): Secondary | ICD-10-CM

## 2020-12-22 DIAGNOSIS — R1084 Generalized abdominal pain: Secondary | ICD-10-CM

## 2020-12-29 ENCOUNTER — Ambulatory Visit: Payer: Managed Care, Other (non HMO) | Admitting: Podiatry

## 2021-01-01 ENCOUNTER — Ambulatory Visit
Admission: RE | Admit: 2021-01-01 | Discharge: 2021-01-01 | Disposition: A | Discharge status: Home / Self Care | Payer: Managed Care, Other (non HMO) | Source: Ambulatory Visit | Attending: Neurosurgery | Admitting: Neurosurgery

## 2021-01-01 ENCOUNTER — Other Ambulatory Visit: Payer: Self-pay

## 2021-01-01 DIAGNOSIS — G930 Cerebral cysts: Secondary | ICD-10-CM

## 2021-01-01 MED ORDER — GADOBENATE DIMEGLUMINE 529 MG/ML IV SOLN
17.0000 mL | Freq: Once | INTRAVENOUS | Status: AC | PRN
  Administered 2021-01-01: 17 mL via INTRAVENOUS

## 2021-01-12 ENCOUNTER — Other Ambulatory Visit: Payer: 59

## 2021-01-13 ENCOUNTER — Other Ambulatory Visit: Payer: Self-pay | Admitting: Radiation Therapy

## 2021-02-01 ENCOUNTER — Other Ambulatory Visit: Payer: Self-pay | Admitting: Endocrinology

## 2021-02-03 ENCOUNTER — Other Ambulatory Visit: Payer: Self-pay

## 2021-02-03 ENCOUNTER — Telehealth: Payer: Self-pay | Admitting: Endocrinology

## 2021-02-03 NOTE — Telephone Encounter (Signed)
MEDICATION: Zofran  PHARMACY:  [facility] at [address]  HAS THE PATIENT CONTACTED THEIR PHARMACY?  yes  IS THIS A 90 DAY SUPPLY : no  LAST APPOINTMENT DATE: '@8'$ /16/2022  NEXT APPOINTMENT DATE:'@9'$ /12/2020  DO WE HAVE YOUR PERMISSION TO LEAVE A DETAILED MESSAGE?: yes [phone]

## 2021-02-07 NOTE — Telephone Encounter (Signed)
Message sent thru MyChart 

## 2021-02-23 ENCOUNTER — Other Ambulatory Visit: Payer: Self-pay

## 2021-02-23 ENCOUNTER — Ambulatory Visit
Admission: RE | Admit: 2021-02-23 | Discharge: 2021-02-23 | Disposition: A | Discharge status: Home / Self Care | Payer: Managed Care, Other (non HMO) | Source: Ambulatory Visit | Attending: Gastroenterology | Admitting: Gastroenterology

## 2021-02-23 ENCOUNTER — Ambulatory Visit (INDEPENDENT_AMBULATORY_CARE_PROVIDER_SITE_OTHER): Payer: Managed Care, Other (non HMO) | Admitting: Endocrinology

## 2021-02-23 VITALS — BP 126/80 | HR 65 | Ht 67.5 in | Wt 187.9 lb

## 2021-02-23 DIAGNOSIS — R140 Abdominal distension (gaseous): Secondary | ICD-10-CM

## 2021-02-23 DIAGNOSIS — E230 Hypopituitarism: Secondary | ICD-10-CM

## 2021-02-23 DIAGNOSIS — R1084 Generalized abdominal pain: Secondary | ICD-10-CM

## 2021-02-23 LAB — BASIC METABOLIC PANEL
BUN: 5 mg/dL — ABNORMAL LOW (ref 6–23)
CO2: 30 mEq/L (ref 19–32)
Calcium: 9.4 mg/dL (ref 8.4–10.5)
Chloride: 104 mEq/L (ref 96–112)
Creatinine, Ser: 0.93 mg/dL (ref 0.40–1.20)
GFR: 75.28 mL/min (ref >60.00)
Glucose, Bld: 80 mg/dL (ref 70–99)
Potassium: 3.3 mEq/L — ABNORMAL LOW (ref 3.5–5.1)
Sodium: 141 mEq/L (ref 135–145)

## 2021-02-23 LAB — T4, FREE: Free T4: 0.47 ng/dL — ABNORMAL LOW (ref 0.60–1.60)

## 2021-02-23 LAB — CORTISOL: Cortisol, Plasma: 0 ug/dL

## 2021-02-23 LAB — TSH: TSH: 1.33 u[IU]/mL (ref 0.35–5.50)

## 2021-02-23 MED ORDER — DESMOPRESSIN ACETATE 0.1 MG PO TABS: 100.0000 ug | ORAL_TABLET | Freq: Every day | ORAL | 3 refills | Status: DC

## 2021-02-23 NOTE — Progress Notes (Signed)
Subjective:    Patient ID: Cindy [place], female    DOB: [birth year], 45 y.o.   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   She has these pit functions:  Prolactin: elevated, with plan for rx when thyroid is better.   ACTH: needs and takes prednisone FSH/LH: low (she has had TAH but not BSO) GH: normal TSH: needs and takes synthroid VP: needs and takes d-DAVP Pt reports intermitt HA and nausea.  She takes prednisone just TIW, and d-DAVP just QHS.  Past Medical History:  Diagnosis Date   Anemia    Anxiety    Cervicogenic headache 12/13/2015   Dyspnea    not normally   Dysrhythmia    palpitations occasionally with chest pain (stabbing pain)   PAC's   GERD (gastroesophageal reflux disease)    High cholesterol    Hypothyroidism    IBS (irritable bowel syndrome)    Low back pain    2 bulging discs   Migraines    Spinal stenosis    Mild    Past Surgical History:  Procedure Laterality Date   ABDOMINAL HYSTERECTOMY     APPLICATION OF CRANIAL NAVIGATION Right 03/22/2018   Procedure: APPLICATION OF CRANIAL NAVIGATION;  Surgeon: Consuella Lose, MD;  Location: [place];  Service: Neurosurgery;  Laterality: Right;  APPLICATION OF CRANIAL NAVIGATION   CRANIOTOMY N/A 02/01/2018   Procedure: CRANIOTOMY TRANSPHENOIDAL RESECTION OF PITUITARY TUMOR;  Surgeon: Consuella Lose, MD;  Location: [place];  Service: Neurosurgery;  Laterality: N/A;  CRANIOTOMY TRANSPHENOIDAL RESECTION OF PITUITARY TUMOR   CRANIOTOMY Right 03/22/2018   Procedure: STEREOTACTIC RIGHT PTERIONAL CRANIOTOMY, RESECTION OF CRANIOPHARYNGIOMA;  Surgeon: Consuella Lose, MD;  Location: [place];  Service: Neurosurgery;  Laterality: Right;   CYSTECTOMY  2006   DILATION AND CURETTAGE OF UTERUS     NASAL SEPTOPLASTY W/ TURBINOPLASTY N/A 02/01/2018    Procedure: POSSIBLE  NASAL SEPTOPLASTY WITH TURBINATE REDUCTION CONSIDER ENDOSCOPIC  EXCISION OF LEFT CONCHA BULLOSA AND REMOVAL OF LEFT MAXILLARY SINUS MUCOUS RETENTION CYST.;  Surgeon: Jerrell Belfast, MD;  Location: [facility] OR;  Service: ENT;  Laterality: N/A;   ORIF ANKLE FRACTURE BIMALLEOLAR  2010   PARTIAL HYSTERECTOMY  2006   TRANSPHENOIDAL APPROACH EXPOSURE N/A 02/01/2018   Procedure: TRANSPHENOIDAL APPROACH EXPOSURE WITH FUSION PROTOCOL;  Surgeon: Jerrell Belfast, MD;  Location: [place];  Service: ENT;  Laterality: N/A;   wisdom teeth removal      Social History   Socioeconomic History   Marital status: Single    Spouse name: Not on file   Number of children: 2   Years of education: College   Highest education level: Not on file  Occupational History    Comment: [employer]  Tobacco Use   Smoking status: Every Day    Packs/day: 0.25    Years: 18.00    Pack years: 4.50    Types: Cigarettes   Smokeless tobacco: Never  Vaping Use   Vaping Use: Never used  Substance and Sexual Activity   Alcohol use: No    Alcohol/week: 0.0 standard drinks    Comment: 0-5 drinks per wk   Drug use: No    Comment: occ marijuana   Sexual activity: Not on file  Other Topics Concern   Not on file  Social History Narrative   Lives  at home w/ her son   Right-handed   Occasional caffeine   Social Determinants of Health   Financial Resource Strain: Not on file  Food Insecurity: Not on file  Transportation Needs: Not on file  Physical Activity: Not on file  Stress: Not on file  Social Connections: Not on file  Intimate Partner Violence: Not on file    Current Outpatient Medications on File Prior to Visit  Medication Sig Dispense Refill   cetirizine (ZYRTEC) 10 MG tablet Take 10 mg by mouth daily as needed for allergies.      Cholecalciferol (VITAMIN D-3) 5000 units TABS Take 5,000 Units by mouth daily.     dicyclomine (BENTYL) 20 MG tablet Take 1 tablet (20 mg total) by  mouth 2 (two) times daily. 20 tablet 0   esomeprazole (NEXIUM) 20 MG capsule Take 20 mg by mouth daily at 12 noon.     famotidine (PEPCID) 20 MG tablet Take 20 mg by mouth daily.     fluticasone (FLONASE) 50 MCG/ACT nasal spray Place 2 sprays into both nostrils 2 (two) times daily.     gabapentin (NEURONTIN) 100 MG capsule Take 1 capsule (100 mg total) by mouth 3 (three) times daily. (Patient taking differently: Take 300 mg by mouth 3 (three) times daily.) 60 capsule 5   Multiple Vitamin (MULTIVITAMIN) tablet Take 1 tablet by mouth daily.     ondansetron (ZOFRAN ODT) 4 MG disintegrating tablet Take 1 tablet (4 mg total) by mouth every 8 (eight) hours as needed for nausea or vomiting. 20 tablet 0   predniSONE (DELTASONE) 5 MG tablet Take 1 tablet (5 mg total) by mouth daily. 90 tablet 3   valACYclovir (VALTREX) 1000 MG tablet Take 1,000 mg by mouth daily as needed (for breakouts).      No current facility-administered medications on file prior to visit.    Allergies  Allergen Reactions   Latex Rash    Family History  Problem Relation Age of Onset   Prostate cancer Maternal Grandfather    Hypertension Maternal Grandfather    High Cholesterol Maternal Grandfather    Hypertension Maternal Grandmother    High Cholesterol Maternal Grandmother    Other Neg Hx        pituitary disorder    BP 126/80 (BP Location: Right Arm, Patient Position: Sitting, Cuff Size: Normal)   Pulse 65   Ht 5' 7.5" (1.715 m)   Wt 187 lb 14.4 oz (85.2 kg)   SpO2 91%   BMI 28.99 kg/m   Review of Systems     Objective:   Physical Exam VITAL SIGNS:  See vs page GENERAL: no distress EXT: no leg edema.     Lab Results  Component Value Date   CREATININE 0.79 07/12/2020   BUN 4 (L) 07/12/2020   NA 138 07/12/2020   K 3.9 07/12/2020   CL 102 07/12/2020   CO2 30 07/12/2020   Free T4=0.47 Prolactin=49    Assessment & Plan:  Central hypothyroidism: uncontrolled.  I have sent a prescription to your  pharmacy, to increase synthroid.   Hyperprolactinemia: uncontrolled.  Recheck in 6 weeks.  Plan is to rx this when thyroid is back to normal.

## 2021-02-23 NOTE — Patient Instructions (Addendum)
blood and urine tests are requested for you today.  We'll let you know about the results.  If the prolactin is high, we'll try a twice weekly pill, to minimize nausea.    Please come back for a follow-up appointment in 6 months.

## 2021-02-24 LAB — URINALYSIS, ROUTINE W REFLEX MICROSCOPIC
Bilirubin Urine: NEGATIVE
Hgb urine dipstick: NEGATIVE
Ketones, ur: NEGATIVE
Leukocytes,Ua: NEGATIVE
Nitrite: NEGATIVE
RBC / HPF: NONE SEEN (ref 0–?)
Specific Gravity, Urine: <=1.005 — AB (ref 1.000–1.030)
Total Protein, Urine: NEGATIVE
Urine Glucose: NEGATIVE
Urobilinogen, UA: 0.2 (ref 0.0–1.0)
WBC, UA: NONE SEEN (ref 0–?)
pH: 7 (ref 5.0–8.0)

## 2021-02-26 MED ORDER — LEVOTHYROXINE SODIUM 75 MCG PO TABS: 75.0000 ug | ORAL_TABLET | Freq: Every day | ORAL | 3 refills | Status: DC

## 2021-02-27 LAB — PROLACTIN: Prolactin: 48.5 ng/mL — ABNORMAL HIGH

## 2021-02-27 LAB — ACTH: C206 ACTH: 6 pg/mL (ref 6–50)

## 2021-04-11 ENCOUNTER — Ambulatory Visit: Payer: Managed Care, Other (non HMO) | Admitting: Psychiatry

## 2021-04-11 ENCOUNTER — Encounter: Payer: Self-pay | Admitting: Psychiatry

## 2021-04-11 ENCOUNTER — Ambulatory Visit: Payer: Managed Care, Other (non HMO) | Admitting: Neurology
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
## 2021-06-09 ENCOUNTER — Other Ambulatory Visit: Payer: Self-pay | Admitting: Neurosurgery

## 2021-06-09 DIAGNOSIS — G930 Cerebral cysts: Secondary | ICD-10-CM

## 2021-07-08 ENCOUNTER — Telehealth: Payer: Self-pay | Admitting: Endocrinology

## 2021-07-08 ENCOUNTER — Other Ambulatory Visit: Payer: Self-pay

## 2021-07-08 DIAGNOSIS — E230 Hypopituitarism: Secondary | ICD-10-CM

## 2021-07-08 MED ORDER — PREDNISONE 5 MG PO TABS: 5.0000 mg | ORAL_TABLET | Freq: Every day | ORAL | 0 refills | Status: DC

## 2021-07-08 NOTE — Telephone Encounter (Signed)
MEDICATION: Prednisone  PHARMACY:  [facility] on [address]  HAS THE PATIENT CONTACTED THEIR PHARMACY?  Yes , needs a new RX for new pharmacy  IS THIS A 90 DAY SUPPLY : YES  IS PATIENT OUT OF MEDICATION: NO  IF NOT; HOW MUCH IS LEFT:   LAST APPOINTMENT DATE: @9 /12/2020  NEXT APPOINTMENT DATE:@3 /06/2021  DO WE HAVE YOUR PERMISSION TO LEAVE A DETAILED MESSAGE?: yes - [phone]  OTHER COMMENTS:    **Let patient know to contact pharmacy at the end of the day to make sure medication is ready. **  ** Please notify patient to allow 48-72 hours to process**  **Encourage patient to contact the pharmacy for refills or they can request refills through [facility]**

## 2021-07-08 NOTE — Telephone Encounter (Signed)
Rx sent 

## 2021-07-16 ENCOUNTER — Ambulatory Visit
Admission: RE | Admit: 2021-07-16 | Discharge: 2021-07-16 | Disposition: A | Discharge status: Home / Self Care | Payer: Managed Care, Other (non HMO) | Source: Ambulatory Visit | Attending: Neurosurgery | Admitting: Neurosurgery

## 2021-07-16 ENCOUNTER — Other Ambulatory Visit: Payer: Self-pay

## 2021-07-16 DIAGNOSIS — G930 Cerebral cysts: Secondary | ICD-10-CM

## 2021-07-16 MED ORDER — GADOBENATE DIMEGLUMINE 529 MG/ML IV SOLN
19.0000 mL | Freq: Once | INTRAVENOUS | Status: AC | PRN
  Administered 2021-07-16: 19 mL via INTRAVENOUS

## 2021-08-17 ENCOUNTER — Ambulatory Visit: Payer: Managed Care, Other (non HMO) | Admitting: Endocrinology

## 2021-10-14 ENCOUNTER — Ambulatory Visit: Payer: Managed Care, Other (non HMO) | Admitting: Endocrinology

## 2021-10-14 VITALS — BP 114/76 | HR 62 | Ht 67.5 in | Wt 198.2 lb

## 2021-10-14 DIAGNOSIS — E230 Hypopituitarism: Secondary | ICD-10-CM

## 2021-10-14 NOTE — Patient Instructions (Addendum)
Please take the levothyroxine 75 mcg/day, every day.   ?Please redo the blood tests in approx 2 weeks.   ?If the prolactin is high, we'll try a twice weekly pill, to minimize nausea.   ?Please come back for a follow-up appointment in 6 months.   ?

## 2021-10-14 NOTE — Progress Notes (Signed)
? ?Subjective:  ? ? Patient ID: Cindy Lamb, female    DOB: 1975-11-09, 46 y.o.   MRN: [id number] ? 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   ?She has these pit functions:  ?Prolactin: elevated, with plan for rx when thyroid is better.   ?ACTH: needs and takes prednisone.   ?FSH/LH: low (she has had TAH but not BSO) ?GH: normal ?TSH: needs and takes synthroid ?VP: needs and takes d-DAVP ?Pt reports intermitt HA and nausea.   ?In 1/23, PCP reduced synthroid, due to low TSH (75/d some days of the week, and 50 some days).    ?Past Medical History:  ?Diagnosis Date  ? Anemia   ? Anxiety   ? Cervicogenic headache 12/13/2015  ? Dyspnea   ? not normally  ? Dysrhythmia   ? palpitations occasionally with chest pain (stabbing pain)   PAC's  ? GERD (gastroesophageal reflux disease)   ? High cholesterol   ? Hypothyroidism   ? IBS (irritable bowel syndrome)   ? Low back pain   ? 2 bulging discs  ? Migraines   ? Spinal stenosis   ? Mild  ? ? ?Past Surgical History:  ?Procedure Laterality Date  ? ABDOMINAL HYSTERECTOMY    ? APPLICATION OF CRANIAL NAVIGATION Right 03/22/2018  ? Procedure: APPLICATION OF CRANIAL NAVIGATION;  Surgeon: Consuella Lose, MD;  Location: [place];  Service: Neurosurgery;  Laterality: Right;  APPLICATION OF CRANIAL NAVIGATION  ? CRANIOTOMY N/A 02/01/2018  ? Procedure: CRANIOTOMY TRANSPHENOIDAL RESECTION OF PITUITARY TUMOR;  Surgeon: Consuella Lose, MD;  Location: [place];  Service: Neurosurgery;  Laterality: N/A;  CRANIOTOMY TRANSPHENOIDAL RESECTION OF PITUITARY TUMOR  ? CRANIOTOMY Right 03/22/2018  ? Procedure: STEREOTACTIC RIGHT PTERIONAL CRANIOTOMY, RESECTION OF CRANIOPHARYNGIOMA;  Surgeon: Consuella Lose, MD;  Location: [place] [facility];  Service: Neurosurgery;  Laterality: Right;  ? CYSTECTOMY  2006  ? DILATION AND CURETTAGE OF UTERUS    ? NASAL  SEPTOPLASTY W/ TURBINOPLASTY N/A 02/01/2018  ? Procedure: POSSIBLE  NASAL SEPTOPLASTY WITH TURBINATE REDUCTION CONSIDER ENDOSCOPIC  EXCISION OF LEFT CONCHA BULLOSA AND REMOVAL OF LEFT MAXILLARY SINUS MUCOUS RETENTION CYST.;  Surgeon: Jerrell Belfast, MD;  Location: [place];  Service: ENT;  Laterality: N/A;  ? ORIF ANKLE FRACTURE BIMALLEOLAR  2010  ? PARTIAL HYSTERECTOMY  2006  ? TRANSPHENOIDAL APPROACH EXPOSURE N/A 02/01/2018  ? Procedure: TRANSPHENOIDAL APPROACH EXPOSURE WITH FUSION PROTOCOL;  Surgeon: Jerrell Belfast, MD;  Location: [facility];  Service: ENT;  Laterality: N/A;  ? wisdom teeth removal    ? ? ?Social History  ? ?Socioeconomic History  ? Marital status: Single  ?  Spouse name: Not on file  ? Number of children: 2  ? Years of education: College  ? Highest education level: Not on file  ?Occupational History  ?  Comment: [employer]  ?Tobacco Use  ? Smoking status: Every Day  ?  Packs/day: 0.25  ?  Years: 18.00  ?  Pack years: 4.50  ?  Types: Cigarettes  ? Smokeless tobacco: Never  ?Vaping Use  ? Vaping Use: Never used  ?Substance and Sexual Activity  ? Alcohol use: No  ?  Alcohol/week: 0.0 standard drinks  ?  Comment: 0-5 drinks per wk  ? Drug use: No  ?  Comment: occ marijuana  ? Sexual activity: Not on file  ?  Other Topics Concern  ? Not on file  ?Social History Narrative  ? Lives at home w/ her son  ? Right-handed  ? Occasional caffeine  ? ?Social Determinants of Health  ? ?Financial Resource Strain: Not on file  ?Food Insecurity: Not on file  ?Transportation Needs: Not on file  ?Physical Activity: Not on file  ?Stress: Not on file  ?Social Connections: Not on file  ?Intimate Partner Violence: Not on file  ? ? ?Current Outpatient Medications on File Prior to Visit  ?Medication Sig Dispense Refill  ? cetirizine (ZYRTEC) 10 MG tablet Take 10 mg by mouth daily as needed for allergies.     ? Cholecalciferol (VITAMIN D-3) 5000 units TABS Take 5,000 Units by mouth daily.    ? desmopressin  (DDAVP) 0.1 MG tablet Take 1 tablet (100 mcg total) by mouth at bedtime. 90 tablet 3  ? dicyclomine (BENTYL) 20 MG tablet Take 1 tablet (20 mg total) by mouth 2 (two) times daily. 20 tablet 0  ? esomeprazole (NEXIUM) 20 MG capsule Take 20 mg by mouth daily at 12 noon.    ? famotidine (PEPCID) 20 MG tablet Take 20 mg by mouth daily.    ? fluticasone (FLONASE) 50 MCG/ACT nasal spray Place 2 sprays into both nostrils 2 (two) times daily.    ? gabapentin (NEURONTIN) 100 MG capsule Take 1 capsule (100 mg total) by mouth 3 (three) times daily. (Patient taking differently: Take 300 mg by mouth 3 (three) times daily.) 60 capsule 5  ? levothyroxine (SYNTHROID) 75 MCG tablet Take 1 tablet (75 mcg total) by mouth daily. 90 tablet 3  ? Multiple Vitamin (MULTIVITAMIN) tablet Take 1 tablet by mouth daily.    ? ondansetron (ZOFRAN ODT) 4 MG disintegrating tablet Take 1 tablet (4 mg total) by mouth every 8 (eight) hours as needed for nausea or vomiting. 20 tablet 0  ? predniSONE (DELTASONE) 5 MG tablet Take 1 tablet (5 mg total) by mouth daily. 90 tablet 0  ? valACYclovir (VALTREX) 1000 MG tablet Take 1,000 mg by mouth daily as needed (for breakouts).     ? ?No current facility-administered medications on file prior to visit.  ? ? ?Allergies  ?Allergen Reactions  ? Latex Rash  ? ? ?Family History  ?Problem Relation Age of Onset  ? Prostate cancer Maternal Grandfather   ? Hypertension Maternal Grandfather   ? High Cholesterol Maternal Grandfather   ? Hypertension Maternal Grandmother   ? High Cholesterol Maternal Grandmother   ? Other Neg Hx   ?     pituitary disorder  ? ? ?BP 114/76 (BP Location: Left Arm, Patient Position: Sitting, Cuff Size: Normal)   Pulse 62   Ht 5' 7.5" (1.715 m)   Wt 198 lb 3.2 oz (89.9 kg)   SpO2 96%   BMI 30.58 kg/m?  ? ? ? ?Review of Systems ? ?   ?Objective:  ? Physical Exam ?VITAL SIGNS:  See vs page.   ?GENERAL: no distress.   ? ? ?   ?Assessment & Plan:  ?Central hypothyroidism: uncontrolled.  in  this setting, synthroid should not be dosed based on TSH alone.   ?Pituitary insuff: recheck labs. ? ?Patient Instructions  ?Please take the levothyroxine 75 mcg/day, every day.   ?Please redo the blood tests in approx 2 weeks.   ?If the prolactin is high, we'll try a twice weekly pill, to minimize nausea.   ?Please come back for a follow-up appointment in 6 months.   ? ? ?

## 2021-10-27 ENCOUNTER — Other Ambulatory Visit (INDEPENDENT_AMBULATORY_CARE_PROVIDER_SITE_OTHER): Payer: Managed Care, Other (non HMO)

## 2021-10-27 DIAGNOSIS — E230 Hypopituitarism: Secondary | ICD-10-CM | POA: Diagnosis not present

## 2021-10-27 LAB — BASIC METABOLIC PANEL
BUN: 6 mg/dL (ref 6–23)
CO2: 25 mEq/L (ref 19–32)
Calcium: 9.5 mg/dL (ref 8.4–10.5)
Chloride: 105 mEq/L (ref 96–112)
Creatinine, Ser: 0.9 mg/dL (ref 0.40–1.20)
GFR: 77.93 mL/min (ref >60.00)
Glucose, Bld: 134 mg/dL — ABNORMAL HIGH (ref 70–99)
Potassium: 3.5 mEq/L (ref 3.5–5.1)
Sodium: 139 mEq/L (ref 135–145)

## 2021-10-27 LAB — TSH: TSH: 0.27 u[IU]/mL — ABNORMAL LOW (ref 0.35–5.50)

## 2021-10-27 LAB — CORTISOL: Cortisol, Plasma: 1.3 ug/dL

## 2021-10-27 LAB — T4, FREE: Free T4: 0.6 ng/dL (ref 0.60–1.60)

## 2021-10-28 ENCOUNTER — Other Ambulatory Visit: Payer: Managed Care, Other (non HMO)

## 2021-11-03 LAB — PROLACTIN: Prolactin: 25.3 ng/mL

## 2021-11-03 LAB — ACTH: C206 ACTH: 13 pg/mL (ref 6–50)

## 2021-12-02 ENCOUNTER — Other Ambulatory Visit: Payer: Self-pay

## 2021-12-02 DIAGNOSIS — E230 Hypopituitarism: Secondary | ICD-10-CM

## 2021-12-02 MED ORDER — LEVOTHYROXINE SODIUM 75 MCG PO TABS: 75.0000 ug | ORAL_TABLET | Freq: Every day | ORAL | 3 refills | Status: DC

## 2021-12-02 MED ORDER — PREDNISONE 5 MG PO TABS: 5.0000 mg | ORAL_TABLET | Freq: Every day | ORAL | 2 refills | Status: DC

## 2022-03-20 ENCOUNTER — Other Ambulatory Visit: Payer: Self-pay

## 2022-03-20 DIAGNOSIS — E230 Hypopituitarism: Secondary | ICD-10-CM

## 2022-03-20 MED ORDER — LEVOTHYROXINE SODIUM 75 MCG PO TABS
75.0000 ug | ORAL_TABLET | Freq: Every day | ORAL | 0 refills | Status: AC
Start: 2022-03-20 — End: ?

## 2022-03-30 ENCOUNTER — Telehealth: Payer: Self-pay | Admitting: Internal Medicine

## 2022-03-30 NOTE — Telephone Encounter (Signed)
MEDICATION: desmopressin (DDAVP) 0.1 MG tablet  PHARMACY:  [facility] on [address] in [place], [place]  [place] CONTACTED [place]?  Yes [facility]  IS THIS A 90 DAY SUPPLY : yes  IS PATIENT OUT OF MEDICATION: yes  IF NOT; HOW MUCH IS LEFT:   LAST APPOINTMENT DATE: @ 10/14/2021  NEXT APPOINTMENT DATE:'@Visit'$  date not found  DO WE HAVE YOUR PERMISSION TO LEAVE A DETAILED MESSAGE?: Yes  OTHER COMMENTS: [facility]   **Let patient know to contact pharmacy at the end of the day to make sure medication is ready. **  ** Please notify patient to allow 48-72 hours to process**  **Encourage patient to contact the pharmacy for refills or they can request refills through [facility]**

## 2022-04-04 NOTE — Telephone Encounter (Signed)
Attempted to call patient, but mailbox full so could not leave a message.

## 2022-04-05 NOTE — Telephone Encounter (Signed)
Attempted to call patient, but no answer and mailbox full.

## 2022-04-06 NOTE — Telephone Encounter (Signed)
Attempted to call the patient, but mailbox full so could not leave message.

## 2022-04-10 ENCOUNTER — Ambulatory Visit
Admission: EM | Admit: 2022-04-10 | Discharge: 2022-04-10 | Disposition: A | Discharge status: Home / Self Care | Payer: Managed Care, Other (non HMO) | Attending: Family Medicine | Admitting: Family Medicine

## 2022-04-10 DIAGNOSIS — K580 Irritable bowel syndrome with diarrhea: Secondary | ICD-10-CM | POA: Diagnosis not present

## 2022-04-10 MED ORDER — DESMOPRESSIN ACETATE 0.1 MG PO TABS: 100.0000 ug | ORAL_TABLET | Freq: Every day | ORAL | 0 refills | Status: AC

## 2022-04-10 MED ORDER — DIPHENOXYLATE-ATROPINE 2.5-0.025 MG PO TABS: 1.0000 | ORAL_TABLET | Freq: Four times a day (QID) | ORAL | 0 refills | Status: AC | PRN

## 2022-04-10 NOTE — ED Triage Notes (Signed)
Pt presents with c/o abdominal pain and diarrhea x 1 week.  Pt endorses hx of IBS.

## 2022-04-10 NOTE — ED Provider Notes (Signed)
[facility]    CSN: [id number] Arrival date & time: 04/10/22  1653      History   Chief Complaint Chief Complaint  Patient presents with   Diarrhea   Abdominal Pain    HPI Cindy Lamb is a 46 y.o. female.   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  She has a long history of-year-old bowel syndrome with diarrhea.  She has been on a number of medications.  She states nothing has helped her.  She has seen 2 different gastroenterologists.  She has an appointment for additional testing pending.  She also is interested in seeing an IBS specialist.  She has a lot of cramping and pain in her abdomen, and predominantly diarrhea.  For the last 5 days she has had 5 or more watery stools a day.  Every time she eats something she has diarrhea.  She states she is also had some vomiting and decreased appetite. She has also had surgery for a brain tumor and is on desmopressin long-term.  Her endocrinologist left the area and her endocrinology office will not refill the medication until she sees a new doctor.  The appointment is not for another month.  I offered to refill her medication until she can be seen.  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[place]) 02/01/2018   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   Cervicogenic headache 12/13/2015    Past Surgical History:   Procedure Laterality Date   ABDOMINAL HYSTERECTOMY     APPLICATION OF CRANIAL NAVIGATION Right 03/22/2018   Procedure: APPLICATION OF CRANIAL NAVIGATION;  Surgeon: Consuella Lose, MD;  Location: [facility] [facility] [place];  Service: Neurosurgery;  Laterality: Right;  APPLICATION OF CRANIAL NAVIGATION   CRANIOTOMY N/A 02/01/2018   Procedure: CRANIOTOMY TRANSPHENOIDAL RESECTION OF PITUITARY TUMOR;  Surgeon: Consuella Lose, MD;  Location: [facility];  Service: Neurosurgery;  Laterality: N/A;  CRANIOTOMY TRANSPHENOIDAL RESECTION OF PITUITARY TUMOR   CRANIOTOMY Right 03/22/2018   Procedure: STEREOTACTIC RIGHT PTERIONAL CRANIOTOMY, RESECTION OF CRANIOPHARYNGIOMA;  Surgeon: Consuella Lose, MD;  Location: [facility];  Service: Neurosurgery;  Laterality: Right;   CYSTECTOMY  2006   DILATION AND CURETTAGE OF UTERUS     HERNIA REPAIR     NASAL SEPTOPLASTY W/ TURBINOPLASTY N/A 02/01/2018   Procedure: POSSIBLE  NASAL SEPTOPLASTY WITH TURBINATE REDUCTION CONSIDER ENDOSCOPIC  EXCISION OF LEFT CONCHA BULLOSA AND REMOVAL OF LEFT MAXILLARY SINUS MUCOUS RETENTION CYST.;  Surgeon: Jerrell Belfast, MD;  Location: [facility];  Service: ENT;  Laterality: N/A;   ORIF ANKLE FRACTURE BIMALLEOLAR  2010   PARTIAL HYSTERECTOMY  2006   TRANSPHENOIDAL APPROACH EXPOSURE N/A 02/01/2018   Procedure: TRANSPHENOIDAL APPROACH EXPOSURE WITH FUSION PROTOCOL;  Surgeon: Jerrell Belfast, MD;  Location: [facility];  Service: ENT;  Laterality: N/A;   wisdom teeth removal      OB History   No obstetric history on file.      Home Medications    Prior to Admission medications   Medication Sig Start Date End Date Taking? Authorizing Provider  diphenoxylate-atropine (LOMOTIL) 2.5-0.025 MG tablet Take 1 tablet by mouth 4 (four) times daily as needed for diarrhea or loose stools. 04/10/22  Yes Raylene Everts, MD  desmopressin (DDAVP) 0.1 MG tablet Take 1 tablet (100 mcg total) by mouth at bedtime. 04/10/22   Raylene Everts, MD  esomeprazole (NEXIUM)  20 MG capsule Take 20 mg by mouth daily at 12 noon.    [provider]  fluticasone (FLONASE) 50 MCG/ACT nasal spray Place 2 sprays into both nostrils 2 (two) times daily.    [provider]  levothyroxine (SYNTHROID) 75 MCG tablet Take 1 tablet (75 mcg total) by mouth daily. 03/20/22   Elayne Snare, MD  predniSONE (DELTASONE) 5 MG tablet Take 1 tablet (5 mg total) by mouth daily. 12/02/21   Elayne Snare, MD    Family History Family History  Problem Relation Age of Onset   Prostate cancer Maternal Grandfather    Hypertension Maternal Grandfather    High Cholesterol Maternal Grandfather    Hypertension Maternal Grandmother    High Cholesterol Maternal Grandmother    Other Neg Hx        pituitary disorder    Social History Social History   Tobacco Use   Smoking status: Every Day    Packs/day: 0.25    Years: 18.00    Total pack years: 4.50    Types: Cigarettes   Smokeless tobacco: Never  Vaping Use   Vaping Use: Never used  Substance Use Topics   Alcohol use: No    Alcohol/week: 0.0 standard drinks of alcohol    Comment: 0-5 drinks per wk   Drug use: No    Comment: occ marijuana     Allergies   Latex   Review of Systems Review of Systems See HPI  Physical Exam Triage Vital Signs ED Triage Vitals  Enc Vitals Group     BP 04/10/22 1703 (!) 142/91     Pulse Rate 04/10/22 1703 70     Resp 04/10/22 1703 14     Temp 04/10/22 1703 98.5 F (36.9 C)     Temp Source 04/10/22 1703 Oral     SpO2 04/10/22 1703 100 %     Weight --      Height --      Head Circumference --      Peak Flow --      Pain Score 04/10/22 1700 5     Pain Loc --      Pain Edu? --      Excl. in [place]? --    No data found.  Updated Vital Signs BP (!) 138/90 (BP Location: Right Arm)   Pulse 68   Temp 98.7 F (37.1 C) (Oral)   Resp 18   SpO2 100%       Physical Exam Constitutional:      General: She is not in acute distress.    Appearance: She is well-developed.  HENT:      Head: Normocephalic and atraumatic.  Eyes:     Conjunctiva/sclera: Conjunctivae normal.     Pupils: Pupils are equal, round, and reactive to light.  Cardiovascular:     Rate and Rhythm: Normal rate and regular rhythm.  Heart sounds: Normal heart sounds.  Pulmonary:     Effort: Pulmonary effort is normal. No respiratory distress.     Breath sounds: Normal breath sounds.  Chest:     Chest wall: No tenderness.  Abdominal:     General: Abdomen is flat. Bowel sounds are normal. There is no distension.     Palpations: Abdomen is soft. There is no hepatomegaly or splenomegaly.     Tenderness: There is generalized abdominal tenderness and tenderness in the epigastric area. There is no guarding or rebound.  Musculoskeletal:        General: Normal range of motion.     Cervical back: Normal range of motion.  Skin:    General: Skin is warm and dry.  Neurological:     General: No focal deficit present.     Mental Status: She is alert.      UC Treatments / Results  Labs (all labs ordered are listed, but only abnormal results are displayed) Labs Reviewed  BASIC METABOLIC PANEL WITH GFR    EKG   Radiology No results found.  Procedures Procedures (including critical care time)  Medications Ordered in UC Medications - No data to display  Initial Impression / Assessment and Plan / UC Course  I have reviewed the triage vital signs and the nursing notes.  Pertinent labs & imaging results that were available during my care of the patient were reviewed by me and considered in my medical decision making (see chart for details).     Patient states she has had minor results from [place].  No help with Bentyl.  Imodium does not help nor does Pepto-Bismol.  I am going to give her Lomotil for the diarrhea and see if this helps with the cramping and pain.  I am also refilling the desmopressin. It is been almost 6 months since her last blood work.  I feel the need to check a BMET since  she has had diarrhea and change in medication. Final Clinical Impressions(s) / UC Diagnoses   Final diagnoses:  Irritable bowel syndrome with diarrhea     Discharge Instructions      Take your desmopressin daily Take Lomotil as needed for cramping and diarrhea Follow-up with gastroenterology and endocrinology as planned Check MyChart for test results.  I will call you if any are abnormal   ED Prescriptions     Medication Sig Dispense Auth. Provider   desmopressin (DDAVP) 0.1 MG tablet Take 1 tablet (100 mcg total) by mouth at bedtime. 90 tablet Raylene Everts, MD   diphenoxylate-atropine (LOMOTIL) 2.5-0.025 MG tablet Take 1 tablet by mouth 4 (four) times daily as needed for diarrhea or loose stools. 30 tablet Raylene Everts, MD      PDMP not reviewed this encounter.   Raylene Everts, MD 04/10/22 Dre[facility]

## 2022-04-10 NOTE — Discharge Instructions (Addendum)
Take your desmopressin daily Take Lomotil as needed for cramping and diarrhea Follow-up with gastroenterology and endocrinology as planned Check MyChart for test results.  I will call you if any are abnormal

## 2022-04-11 ENCOUNTER — Telehealth: Payer: Self-pay | Admitting: Emergency Medicine

## 2022-04-11 LAB — BASIC METABOLIC PANEL WITH GFR
BUN/Creatinine Ratio: 5 (calc) — ABNORMAL LOW (ref 6–22)
BUN: 4 mg/dL — ABNORMAL LOW (ref 7–25)
CO2: 28 mmol/L (ref 20–32)
Calcium: 11 mg/dL — ABNORMAL HIGH (ref 8.6–10.2)
Chloride: 105 mmol/L (ref 98–110)
Creat: 0.8 mg/dL (ref 0.50–0.99)
Glucose, Bld: 94 mg/dL (ref 65–99)
Potassium: 4.1 mmol/L (ref 3.5–5.3)
Sodium: 142 mmol/L (ref 135–146)
eGFR: 93 mL/min/{1.73_m2} (ref >=60)

## 2022-04-11 NOTE — Telephone Encounter (Signed)
Call to [facility] to see how she was today- per pt she was not able to pick her medicine until today ( it was not ready last night). Pt was able to get 20 pills of lomotil not the full 30 due to restrictions per pharmacy per pt. Pt to call back if she needs the additional 10  Lomotil pills. No other problems with other med prescribed  by Dr Meda Coffee. Pt has had 2 bouts of diarrhea this am & called out of work. Work note declined at this time - pt states her place of employment does not take work excuses. Pt thanked Therapist, sports for call and follow up. No other questions or concerns at this time.

## 2022-05-16 ENCOUNTER — Other Ambulatory Visit (HOSPITAL_COMMUNITY): Payer: Self-pay | Admitting: Gastroenterology

## 2022-05-16 DIAGNOSIS — R1084 Generalized abdominal pain: Secondary | ICD-10-CM

## 2022-05-16 DIAGNOSIS — R112 Nausea with vomiting, unspecified: Secondary | ICD-10-CM

## 2022-05-30 ENCOUNTER — Ambulatory Visit (HOSPITAL_COMMUNITY): Payer: Managed Care, Other (non HMO)

## 2022-06-30 ENCOUNTER — Encounter (HOSPITAL_COMMUNITY): Payer: Self-pay

## 2022-06-30 ENCOUNTER — Ambulatory Visit (HOSPITAL_COMMUNITY): Payer: Managed Care, Other (non HMO) | Attending: Gastroenterology

## 2022-09-20 ENCOUNTER — Ambulatory Visit
Admission: EM | Admit: 2022-09-20 | Discharge: 2022-09-20 | Disposition: A | Discharge status: Home / Self Care | Payer: Managed Care, Other (non HMO)

## 2022-09-20 ENCOUNTER — Encounter: Payer: Self-pay | Admitting: Emergency Medicine

## 2022-09-20 DIAGNOSIS — E559 Vitamin D deficiency, unspecified: Secondary | ICD-10-CM

## 2022-09-20 DIAGNOSIS — R109 Unspecified abdominal pain: Secondary | ICD-10-CM

## 2022-09-20 MED ORDER — VITAMIN D (ERGOCALCIFEROL) 1.25 MG (50000 UNIT) PO CAPS: 50000.0000 [IU] | ORAL_CAPSULE | ORAL | 0 refills | Status: AC

## 2022-09-20 MED ORDER — CYCLOBENZAPRINE HCL 10 MG PO TABS: 10.0000 mg | ORAL_TABLET | Freq: Two times a day (BID) | ORAL | 0 refills | Status: AC | PRN

## 2022-09-20 MED ORDER — ACETAMINOPHEN 325 MG PO TABS
650.0000 mg | ORAL_TABLET | Freq: Once | ORAL | Status: AC
  Administered 2022-09-20: 650 mg via ORAL

## 2022-09-20 NOTE — ED Triage Notes (Signed)
Muscle spasms across abdomen  today  Pt has IBS  Pt as taken muscle relaxers in the past which has helped  Has a new GI  doctor

## 2022-09-20 NOTE — Discharge Instructions (Addendum)
Stay well hydrated Take the vitamin d once a week Take the flexeril as needed Follow up with your PCP for refills

## 2022-09-21 NOTE — ED Provider Notes (Signed)
[facility]    CSN: [id number] Arrival date & time: 09/20/22  1915      History   Chief Complaint Chief Complaint  Patient presents with   Abdominal Pain    HPI Cindy Lamb is a 47 y.o. female.   HPI  Patient states she has been having some muscle spasms in her abdomen.  At first I thought she was describing abdominal cramping like she gets with irritable bowel syndrome but she states that it is the muscles of her abdominal wall because she also has muscle spasms in her neck and shoulder at times and other parts of her body.  This has been going on for an unclear period of time perhaps weeks.  She states she used to take Flexeril when her muscles bothered her but she is out of this medicine.  She was unable to get a hold of her PCP to get more of the Flexeril.  She would like a refill of this medication.  I was talking to her about muscle spasms in general to make sure that she is hydrated, and to see if there is any reason this might be happening.  I reviewed her recent lab work and she is vitamin D deficient.  She knows this but she is not taking a vitamin D supplement.  I am prescribing high-dose vitamin D for her to take for a month but advised her to see her PCP in follow-up.  Appetite is normal.  No nausea or vomiting.  No problems with bowels.  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   Cervicogenic headache 12/13/2015    Past Surgical History:  Procedure Laterality Date   ABDOMINAL HYSTERECTOMY     APPLICATION OF CRANIAL NAVIGATION Right 03/22/2018   Procedure: APPLICATION OF CRANIAL NAVIGATION;  Surgeon: Consuella Lose, MD;  Location: [place];  Service: Neurosurgery;  Laterality: Right;  APPLICATION OF CRANIAL NAVIGATION   CRANIOTOMY N/A 02/01/2018   Procedure: CRANIOTOMY TRANSPHENOIDAL RESECTION OF PITUITARY TUMOR;  Surgeon: Consuella Lose, MD;  Location: [place];  Service: Neurosurgery;  Laterality: N/A;  CRANIOTOMY TRANSPHENOIDAL RESECTION OF PITUITARY TUMOR   CRANIOTOMY Right 03/22/2018   Procedure: STEREOTACTIC RIGHT PTERIONAL CRANIOTOMY, RESECTION OF CRANIOPHARYNGIOMA;  Surgeon: Consuella Lose, MD;  Location: [place];  Service: Neurosurgery;  Laterality: Right;   CYSTECTOMY  2006   DILATION AND CURETTAGE OF UTERUS     HERNIA REPAIR     NASAL SEPTOPLASTY W/ TURBINOPLASTY N/A 02/01/2018   Procedure: POSSIBLE  NASAL SEPTOPLASTY WITH TURBINATE REDUCTION CONSIDER ENDOSCOPIC  EXCISION OF LEFT CONCHA BULLOSA AND REMOVAL OF LEFT MAXILLARY SINUS MUCOUS RETENTION CYST.;  Surgeon: Jerrell Belfast, MD;  Location: [place];  Service: ENT;  Laterality: N/A;  ORIF ANKLE FRACTURE BIMALLEOLAR  2010   PARTIAL HYSTERECTOMY  2006   TRANSPHENOIDAL APPROACH EXPOSURE N/A 02/01/2018   Procedure: TRANSPHENOIDAL APPROACH EXPOSURE WITH FUSION PROTOCOL;  Surgeon: Jerrell Belfast, MD;  Location: [place];  Service: ENT;  Laterality: N/A;   wisdom teeth removal      OB History   No obstetric history on file.      Home Medications    Prior to Admission medications   Medication Sig Start Date End Date Taking? Authorizing Provider  cyclobenzaprine (FLEXERIL) 10 MG tablet Take 1 tablet (10 mg total) by mouth 2 (two) times daily as needed for muscle spasms. 09/20/22  Yes Raylene Everts, MD  Vitamin D, Ergocalciferol,  (DRISDOL) 1.25 MG (50000 UNIT) CAPS capsule Take 1 capsule (50,000 Units total) by mouth every 7 (seven) days. 09/20/22  Yes Raylene Everts, MD  desmopressin (DDAVP) 0.1 MG tablet Take 1 tablet (100 mcg total) by mouth at bedtime. 04/10/22   Raylene Everts, MD  desmopressin (DDAVP) 0.2 MG tablet Take by mouth. Patient not taking: Reported on 09/20/2022 12/23/18   [provider]  diphenoxylate-atropine (LOMOTIL) 2.5-0.025 MG tablet Take 1 tablet by mouth 4 (four) times daily as needed for diarrhea or loose stools. 04/10/22   Raylene Everts, MD  esomeprazole (NEXIUM) 20 MG capsule Take 20 mg by mouth daily at 12 noon.    [provider]  fluticasone (FLONASE) 50 MCG/ACT nasal spray Place 2 sprays into both nostrils 2 (two) times daily.    [provider]  levothyroxine (SYNTHROID) 75 MCG tablet Take 1 tablet (75 mcg total) by mouth daily. 03/20/22   Elayne Snare, MD    Family History Family History  Problem Relation Age of Onset   Prostate cancer Maternal Grandfather    Hypertension Maternal Grandfather    High Cholesterol Maternal Grandfather    Hypertension Maternal Grandmother    High Cholesterol Maternal Grandmother    Other Neg Hx        pituitary disorder    Social History Social History   Tobacco Use   Smoking status: Every Day    Packs/day: 0.25    Years: 18.00    Additional pack years: 0.00    Total pack years: 4.50    Types: Cigarettes   Smokeless tobacco: Never  Vaping Use   Vaping Use: Never used  Substance Use Topics   Alcohol use: No    Alcohol/week: 0.0 standard drinks of alcohol    Comment: 0-5 drinks per wk   Drug use: No    Comment: occ marijuana     Allergies   Latex   Review of Systems Review of Systems  See HPI Physical Exam Triage Vital Signs ED Triage Vitals  Enc Vitals Group     BP 09/20/22 1920 129/84     Pulse Rate 09/20/22 1920 78     Resp 09/20/22 1920 16     Temp 09/20/22 1920 98.4 F (36.9 C)      Temp Source 09/20/22 1920 Oral     SpO2 09/20/22 1920 97 %     Weight 09/20/22 1923 203 lb (92.1 kg)     Height --      Head Circumference --      Peak Flow --      Pain Score 09/20/22 1922 5     Pain Loc --      Pain Edu? --      Excl. in GC? --    No  data found.  Updated Vital Signs BP 129/84 (BP Location: Left Arm)   Pulse 78   Temp 98.4 F (36.9 C) (Oral)   Resp 16   Wt 92.1 kg   SpO2 97%   BMI 31.33 kg/m      Physical Exam Constitutional:      General: She is not in acute distress.    Appearance: Normal appearance. She is well-developed.  HENT:     Head: Normocephalic and atraumatic.  Eyes:     Conjunctiva/sclera: Conjunctivae normal.     Pupils: Pupils are equal, round, and reactive to light.  Cardiovascular:     Rate and Rhythm: Normal rate and regular rhythm.     Heart sounds: Normal heart sounds.  Pulmonary:     Effort: Pulmonary effort is normal. No respiratory distress.     Breath sounds: Normal breath sounds.  Abdominal:     General: There is no distension.     Palpations: Abdomen is soft.  Musculoskeletal:        General: No swelling, tenderness, deformity or signs of injury. Normal range of motion.     Cervical back: Normal range of motion and neck supple. No tenderness.  Skin:    General: Skin is warm and dry.  Neurological:     General: No focal deficit present.     Mental Status: She is alert.     Gait: Gait abnormal.      UC Treatments / Results  Labs (all labs ordered are listed, but only abnormal results are displayed) Labs Reviewed - No data to display  EKG   Radiology No results found.  Procedures Procedures (including critical care time)  Medications Ordered in UC Medications  acetaminophen (TYLENOL) tablet 650 mg (650 mg Oral Given 09/20/22 1934)    Initial Impression / Assessment and Plan / UC Course  I have reviewed the triage vital signs and the nursing notes.  Pertinent labs & imaging results that were available  during my care of the patient were reviewed by me and considered in my medical decision making (see chart for details).     I told the patient if she continued to have muscle pain and muscle spasm she needed to make an appointment with her PCP for workup.  There is a number of things that can cause this.  Now I will give her vitamin D replacement and a refill of her Flexeril. Final Clinical Impressions(s) / UC Diagnoses   Final diagnoses:  Abdominal cramps  Vitamin D deficiency     Discharge Instructions      Stay well hydrated Take the vitamin d once a week Take the flexeril as needed Follow up with your PCP for refills   ED Prescriptions     Medication Sig Dispense Auth. Provider   cyclobenzaprine (FLEXERIL) 10 MG tablet Take 1 tablet (10 mg total) by mouth 2 (two) times daily as needed for muscle spasms. 60 tablet Raylene Everts, MD   Vitamin D, Ergocalciferol, (DRISDOL) 1.25 MG (50000 UNIT) CAPS capsule Take 1 capsule (50,000 Units total) by mouth every 7 (seven) days. 4 capsule Raylene Everts, MD      PDMP not reviewed this encounter.   Raylene Everts, MD 09/21/22 [phone]

## 2022-11-08 ENCOUNTER — Ambulatory Visit
Admission: EM | Admit: 2022-11-08 | Discharge: 2022-11-08 | Disposition: A | Discharge status: Home / Self Care | Payer: Managed Care, Other (non HMO) | Attending: Family Medicine | Admitting: Family Medicine

## 2022-11-08 ENCOUNTER — Encounter: Payer: Self-pay | Admitting: Emergency Medicine

## 2022-11-08 DIAGNOSIS — L02214 Cutaneous abscess of groin: Secondary | ICD-10-CM

## 2022-11-08 MED ORDER — DOXYCYCLINE HYCLATE 100 MG PO CAPS: 100.0000 mg | ORAL_CAPSULE | Freq: Two times a day (BID) | ORAL | 0 refills | Status: AC

## 2022-11-08 NOTE — Discharge Instructions (Addendum)
Instructed patient to take medication as directed with food to completion.  Encouraged increase daily water intake while taking this medication.  Advised patient to follow-up with general surgery for further evaluation of current abscess of groin, right.  2 surgery center contacts have been provided with this AVS.

## 2022-11-08 NOTE — ED Provider Notes (Signed)
[name]    CSN: [id number] Arrival date & time: 11/08/22  1059      History   Chief Complaint Chief Complaint  Patient presents with   Abscess    HPI Cindy Lamb is a 47 y.o. female.   HPI very pleasant 47 year old female presents with bump of right groin area for 3 weeks.  Patient believes this may be an infected hair or abscess and has gotten worsened over time.  PMH significant for morbid obesity, hypothyroidism, and IBS.  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   Cervicogenic headache 12/13/2015    Past Surgical History:  Procedure Laterality Date   ABDOMINAL HYSTERECTOMY     APPLICATION OF CRANIAL NAVIGATION Right 03/22/2018   Procedure: APPLICATION OF CRANIAL NAVIGATION;  Surgeon: Lisbeth Renshaw, MD;  Location: [facility];  Service: Neurosurgery;  Laterality: Right;  APPLICATION OF CRANIAL NAVIGATION   CRANIOTOMY N/A 02/01/2018   Procedure: CRANIOTOMY TRANSPHENOIDAL RESECTION OF PITUITARY TUMOR;  Surgeon: Lisbeth Renshaw, MD;  Location: [facility];  Service: Neurosurgery;  Laterality: N/A;  CRANIOTOMY TRANSPHENOIDAL RESECTION OF PITUITARY TUMOR   CRANIOTOMY Right 03/22/2018   Procedure: STEREOTACTIC RIGHT PTERIONAL CRANIOTOMY, RESECTION OF CRANIOPHARYNGIOMA;  Surgeon: Lisbeth Renshaw, MD;   Location: [facility];  Service: Neurosurgery;  Laterality: Right;   CYSTECTOMY  2006   DILATION AND CURETTAGE OF UTERUS     HERNIA REPAIR     NASAL SEPTOPLASTY W/ TURBINOPLASTY N/A 02/01/2018   Procedure: POSSIBLE  NASAL SEPTOPLASTY WITH TURBINATE REDUCTION CONSIDER ENDOSCOPIC  EXCISION OF LEFT CONCHA BULLOSA AND REMOVAL OF LEFT MAXILLARY SINUS MUCOUS RETENTION CYST.;  Surgeon: Osborn Coho, MD;  Location: [facility] OR;  Service: ENT;  Laterality: N/A;   ORIF ANKLE FRACTURE BIMALLEOLAR  2010   PARTIAL HYSTERECTOMY  2006   TRANSPHENOIDAL APPROACH EXPOSURE N/A 02/01/2018   Procedure: TRANSPHENOIDAL APPROACH EXPOSURE WITH FUSION PROTOCOL;  Surgeon: Osborn Coho, MD;  [facility] OR;  Service: ENT;  Laterality: N/A;   wisdom teeth removal      OB History   No obstetric history on file.      Home Medications    Prior to Admission medications   Medication Sig Start Date End Date Taking? Authorizing Provider  doxycycline (VIBRAMYCIN) 100 MG capsule Take 1 capsule (100 mg total) by mouth 2 (two) times daily for 10 days. 11/08/22 11/18/22 Yes Trevor Iha, FNP  doxycycline (VIBRAMYCIN) 100 MG capsule Take 1 capsule (100 mg total) by mouth 2 (two) times daily for 10 days. 11/08/22 11/18/22 Yes Trevor Iha, FNP  cyclobenzaprine (FLEXERIL) 10 MG tablet Take 1 tablet (10 mg total) by mouth 2 (two) times daily as needed for muscle spasms. 09/20/22   Eustace Moore, MD  desmopressin (  DDAVP) 0.1 MG tablet Take 1 tablet (100 mcg total) by mouth at bedtime. 04/10/22   Eustace Moore, MD  desmopressin (DDAVP) 0.2 MG tablet Take by mouth. Patient not taking: Reported on 09/20/2022 12/23/18   [provider]  diphenoxylate-atropine (LOMOTIL) 2.5-0.025 MG tablet Take 1 tablet by mouth 4 (four) times daily as needed for diarrhea or loose stools. 04/10/22   Eustace Moore, MD  esomeprazole (NEXIUM) 20 MG capsule Take 20 mg by mouth daily at 12 noon.    [provider]  fluticasone  (FLONASE) 50 MCG/ACT nasal spray Place 2 sprays into both nostrils 2 (two) times daily.    [provider]  levothyroxine (SYNTHROID) 75 MCG tablet Take 1 tablet (75 mcg total) by mouth daily. 03/20/22   Reather Littler, MD  Vitamin D, Ergocalciferol, (DRISDOL) 1.25 MG (50000 UNIT) CAPS capsule Take 1 capsule (50,000 Units total) by mouth every 7 (seven) days. 09/20/22   Eustace Moore, MD    Family History Family History  Problem Relation Age of Onset   Prostate cancer Maternal Grandfather    Hypertension Maternal Grandfather    High Cholesterol Maternal Grandfather    Hypertension Maternal Grandmother    High Cholesterol Maternal Grandmother    Other Neg Hx        pituitary disorder    Social History Social History   Tobacco Use   Smoking status: Every Day    Packs/day: 0.25    Years: 18.00    Additional pack years: 0.00    Total pack years: 4.50    Types: Cigarettes   Smokeless tobacco: Never  Vaping Use   Vaping Use: Never used  Substance Use Topics   Alcohol use: No    Alcohol/week: 0.0 standard drinks of alcohol    Comment: 0-5 drinks per wk   Drug use: No    Comment: occ marijuana     Allergies   Latex   Review of Systems Review of Systems  Skin:  Positive for rash.  All other systems reviewed and are negative.    Physical Exam Triage Vital Signs ED Triage Vitals  Enc Vitals Group     BP 11/08/22 1116 113/76     Pulse Rate 11/08/22 1116 (!) 101     Resp 11/08/22 1116 16     Temp 11/08/22 1116 97.9 F (36.6 C)     Temp Source 11/08/22 1116 Oral     SpO2 11/08/22 1116 99 %     Weight --      Height --      Head Circumference --      Peak Flow --      Pain Score 11/08/22 1118 5     Pain Loc --      Pain Edu? --      Excl. in GC? --    No data found.  Updated Vital Signs BP 113/76 (BP Location: Right Arm)   Pulse (!) 101   Temp 97.9 F (36.6 C) (Oral)   Resp 16   SpO2 99%       Physical Exam Vitals and nursing note reviewed.  Exam conducted with a chaperone present.  Constitutional:      Appearance: Normal appearance. She is obese.  HENT:     Head: Normocephalic and atraumatic.     Mouth/Throat:     Mouth: Mucous membranes are moist.     Pharynx: Oropharynx is clear.  Eyes:     Extraocular Movements: Extraocular movements intact.  Conjunctiva/sclera: Conjunctivae normal.     Pupils: Pupils are equal, round, and reactive to light.  Cardiovascular:     Rate and Rhythm: Normal rate and regular rhythm.     Pulses: Normal pulses.     Heart sounds: Normal heart sounds.  Pulmonary:     Effort: Pulmonary effort is normal.     Breath sounds: Normal breath sounds. No wheezing, rhonchi or rales.  Genitourinary:    Comments: Right superior inner thigh:~7.0 cm x 4.0 cm rectangular shaped non-erythematous non-indurated, non-fluctuant cutaneous cyst, TTP, no discharge, bleeding, or drainage noted Musculoskeletal:        General: Normal range of motion.     Cervical back: Normal range of motion and neck supple.  Skin:    General: Skin is warm and dry.  Neurological:     General: No focal deficit present.     Mental Status: She is alert and oriented to person, place, and time. Mental status is at baseline.      UC Treatments / Results  Labs (all labs ordered are listed, but only abnormal results are displayed) Labs Reviewed - No data to display  EKG   Radiology No results found.  Procedures Procedures (including critical care time)  Medications Ordered in UC Medications - No data to display  Initial Impression / Assessment and Plan / UC Course  I have reviewed the triage vital signs and the nursing notes.  Pertinent labs & imaging results that were available during my care of the patient were reviewed by me and considered in my medical decision making (see chart for details).     MDM: 1.  Abscess of groin, right Rx'd doxycycline 100 mg capsule twice daily x 10 days.  Advised patient will need  general surgery consult to remove dermal cyst.  Contact information provided with AVS. Instructed patient to take medication as directed with food to completion.  Encouraged increase daily water intake while taking this medication.  Advised patient to follow-up with general surgery for further evaluation of current abscess of groin, right.  2 surgery center contacts have been provided with this AVS. patient discharged home, hemodynamically stable. Final Clinical Impressions(s) / UC Diagnoses   Final diagnoses:  Abscess of groin, right     Discharge Instructions      Instructed patient to take medication as directed with food to completion.  Encouraged increase daily water intake while taking this medication.  Advised patient to follow-up with general surgery for further evaluation of current abscess of groin, right.  2 surgery center contacts have been provided with this AVS.     ED Prescriptions     Medication Sig Dispense Auth. Provider   doxycycline (VIBRAMYCIN) 100 MG capsule Take 1 capsule (100 mg total) by mouth 2 (two) times daily for 10 days. 20 capsule Trevor Iha, FNP   doxycycline (VIBRAMYCIN) 100 MG capsule Take 1 capsule (100 mg total) by mouth 2 (two) times daily for 10 days. 20 capsule Trevor Iha, FNP      PDMP not reviewed this encounter.   Trevor Iha, FNP 11/08/22 1236

## 2022-11-08 NOTE — ED Triage Notes (Signed)
Pt c/o bump on her groin. She thought this was a razor burn but she tried to pop the bump over 1 week ago  and now it is abscess and painful on her right groin/thigh area.

## 2022-12-22 ENCOUNTER — Other Ambulatory Visit: Payer: Self-pay | Admitting: Neurosurgery

## 2022-12-22 DIAGNOSIS — G930 Cerebral cysts: Secondary | ICD-10-CM

## 2023-01-14 ENCOUNTER — Ambulatory Visit
Admission: RE | Admit: 2023-01-14 | Discharge: 2023-01-14 | Discharge status: Home / Self Care | Payer: Managed Care, Other (non HMO) | Source: Ambulatory Visit | Attending: Neurosurgery

## 2023-01-14 DIAGNOSIS — G930 Cerebral cysts: Secondary | ICD-10-CM

## 2023-01-14 MED ORDER — GADOPICLENOL 0.5 MMOL/ML IV SOLN
10.0000 mL | Freq: Once | INTRAVENOUS | Status: AC | PRN
  Administered 2023-01-14: 10 mL via INTRAVENOUS

## 2023-02-10 ENCOUNTER — Other Ambulatory Visit: Payer: Managed Care, Other (non HMO)

## 2023-12-26 ENCOUNTER — Ambulatory Visit
Admission: EM | Admit: 2023-12-26 | Discharge: 2023-12-26 | Disposition: A | Discharge status: Other Acute Inpatient Hospital | Attending: Family Medicine | Admitting: Family Medicine

## 2023-12-26 ENCOUNTER — Encounter: Payer: Self-pay | Admitting: Emergency Medicine

## 2023-12-26 DIAGNOSIS — R519 Headache, unspecified: Secondary | ICD-10-CM | POA: Diagnosis not present

## 2023-12-26 DIAGNOSIS — H539 Unspecified visual disturbance: Secondary | ICD-10-CM | POA: Diagnosis not present

## 2023-12-26 DIAGNOSIS — R079 Chest pain, unspecified: Secondary | ICD-10-CM

## 2023-12-26 DIAGNOSIS — R001 Bradycardia, unspecified: Secondary | ICD-10-CM

## 2023-12-26 NOTE — ED Provider Notes (Signed)
 [facility]    CSN: [id number] Arrival date & time: 12/26/23  1126      History   Chief Complaint Chief Complaint  Patient presents with   Headache    HPI Cindy Lamb is a 48 y.o. female.   HPI  Patient is here complaining of a headache.  She states she also has some cough and congestion.  She states this is a severe headache worse than her usual.  She states she is also having some feeling like she cannot focus.  Some photophobia.  Lightheadedness.  She states that she also has had chest pain off and on for the last couple of days.  She just finished a course of steroids and Z-Pak for an upper respiratory infection.  She states she does have a history of migraines but this feels different.  With her migraines she has never had any visual symptoms.  Denies trauma.  Denies fever.  No problems with cognition or thinking.  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 She also states that she has been feeling lightheaded and noticed that her pulse was as low as 40  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   Cervicogenic headache 12/13/2015    Past Surgical History:  Procedure Laterality Date   ABDOMINAL HYSTERECTOMY     APPLICATION OF  CRANIAL NAVIGATION Right 03/22/2018   Procedure: APPLICATION OF CRANIAL NAVIGATION;  Surgeon: Lanis Pupa, MD;  Location: [facility] OR;  Service: Neurosurgery;  Laterality: Right;  APPLICATION OF CRANIAL NAVIGATION   CRANIOTOMY N/A 02/01/2018   Procedure: CRANIOTOMY TRANSPHENOIDAL RESECTION OF PITUITARY TUMOR;  Surgeon: Lanis Pupa, MD;  Location: [facility] OR;  Service: Neurosurgery;  Laterality: N/A;  CRANIOTOMY TRANSPHENOIDAL RESECTION OF PITUITARY TUMOR   CRANIOTOMY Right 03/22/2018   Procedure: STEREOTACTIC RIGHT PTERIONAL CRANIOTOMY, RESECTION OF CRANIOPHARYNGIOMA;  Surgeon: Lanis Pupa, MD;  Location: [facility] OR;  Service: Neurosurgery;  Laterality: Right;   CYSTECTOMY  2006   DILATION AND CURETTAGE OF UTERUS     HERNIA REPAIR     NASAL SEPTOPLASTY W/ TURBINOPLASTY N/A 02/01/2018   Procedure: POSSIBLE  NASAL SEPTOPLASTY WITH TURBINATE REDUCTION CONSIDER ENDOSCOPIC  EXCISION OF LEFT CONCHA BULLOSA AND REMOVAL OF LEFT MAXILLARY SINUS MUCOUS RETENTION CYST.;  Surgeon: Mable Lenis, MD;  Location: [facility] OR;  Service: ENT;  Laterality: N/A;   ORIF ANKLE FRACTURE BIMALLEOLAR  2010   PARTIAL HYSTERECTOMY  2006   TRANSPHENOIDAL APPROACH EXPOSURE N/A 02/01/2018   Procedure: TRANSPHENOIDAL APPROACH EXPOSURE WITH FUSION PROTOCOL;  Surgeon: Mable Lenis, MD;  Location: [facility] OR;  Service: ENT;  Laterality: N/A;   wisdom teeth removal  OB History   No obstetric history on file.      Home Medications    Prior to Admission medications   Medication Sig Start Date End Date Taking? Authorizing Provider  desmopressin  (DDAVP ) 0.1 MG tablet Take 1 tablet (100 mcg total) by mouth at bedtime. 04/10/22  Yes Maranda Jamee Jacob, MD  esomeprazole (NEXIUM) 20 MG capsule Take 20 mg by mouth daily at 12 noon.   Yes [provider]  fluticasone  (FLONASE ) 50 MCG/ACT nasal spray Place 2 sprays into both nostrils 2 (two) times daily.   Yes [provider]  levothyroxine  (SYNTHROID ) 75  MCG tablet Take 1 tablet (75 mcg total) by mouth daily. 03/20/22  Yes Von Pacific, MD  neomycin-polymyxin-dexamethasone  (MAXITROL) 0.1 % ophthalmic suspension Apply 1 drop to eye 4 (four) times daily. 12/19/23  Yes [provider]  Vitamin D , Ergocalciferol , (DRISDOL ) 1.25 MG (50000 UNIT) CAPS capsule Take 1 capsule (50,000 Units total) by mouth every 7 (seven) days. 09/20/22  Yes Maranda Jamee Jacob, MD  cyclobenzaprine  (FLEXERIL ) 10 MG tablet Take 1 tablet (10 mg total) by mouth 2 (two) times daily as needed for muscle spasms. 09/20/22   Maranda Jamee Jacob, MD  desmopressin  (DDAVP ) 0.2 MG tablet Take by mouth. Patient not taking: Reported on 09/20/2022 12/23/18   [provider]  diphenoxylate -atropine  (LOMOTIL ) 2.5-0.025 MG tablet Take 1 tablet by mouth 4 (four) times daily as needed for diarrhea or loose stools. 04/10/22   Maranda Jamee Jacob, MD    Family History Family History  Problem Relation Age of Onset   Prostate cancer Maternal Grandfather    Hypertension Maternal Grandfather    High Cholesterol Maternal Grandfather    Hypertension Maternal Grandmother    High Cholesterol Maternal Grandmother    Other Neg Hx        pituitary disorder    Social History Social History   Tobacco Use   Smoking status: Every Day    Current packs/day: 0.25    Average packs/day: 0.3 packs/day for 18.0 years (4.5 ttl pk-yrs)    Types: Cigarettes   Smokeless tobacco: Never  Vaping Use   Vaping status: Never Used  Substance Use Topics   Alcohol use: No    Alcohol/week: 0.0 standard drinks of alcohol    Comment: 0-5 drinks per wk   Drug use: No    Comment: occ marijuana     Allergies   Latex   Review of Systems Review of Systems See HPI  Physical Exam Triage Vital Signs ED Triage Vitals  Encounter Vitals Group     BP 12/26/23 1146 129/86     Girls Systolic BP Percentile --      Girls Diastolic BP Percentile --      Boys Systolic BP Percentile --      Boys Diastolic BP  Percentile --      Pulse Rate 12/26/23 1146 (!) 53     Resp 12/26/23 1146 18     Temp 12/26/23 1146 98.4 F (36.9 C)     Temp Source 12/26/23 1146 Oral     SpO2 12/26/23 1146 99 %     Weight 12/26/23 1148 205 lb (93 kg)     Height 12/26/23 1148 5' 7.5 (1.715 m)     Head Circumference --      Peak Flow --      Pain Score 12/26/23 1148 10     Pain Loc --      Pain Education --  Exclude from Growth Chart --    No data found.  Updated Vital Signs BP 129/86 (BP Location: Right Arm)   Pulse (!) 53   Temp 98.4 F (36.9 C) (Oral)   Resp 18   Ht 5' 7.5 (1.715 m)   Wt 93 kg   SpO2 99%   BMI 31.63 kg/m       Physical Exam Constitutional:      General: She is not in acute distress.    Appearance: She is well-developed and normal weight.     Comments: Patient has lites turned low in the exam room.  She is squinting and uncomfortable when they are turned on.  HENT:     Head: Normocephalic and atraumatic.  Eyes:     General: No visual field deficit.    Conjunctiva/sclera: Conjunctivae normal.     Pupils: Pupils are equal, round, and reactive to light.  Cardiovascular:     Rate and Rhythm: Normal rate.  Pulmonary:     Effort: Pulmonary effort is normal. No respiratory distress.  Abdominal:     General: There is no distension.     Palpations: Abdomen is soft.  Musculoskeletal:        General: Normal range of motion.     Cervical back: Normal range of motion.  Skin:    General: Skin is warm and dry.  Neurological:     Mental Status: She is alert and oriented to person, place, and time. Mental status is at baseline.     Cranial Nerves: No cranial nerve deficit, dysarthria or facial asymmetry.     Motor: No weakness.     Gait: Gait normal.     Deep Tendon Reflexes: Reflexes normal.      UC Treatments / Results  Labs (all labs ordered are listed, but only abnormal results are displayed) Labs Reviewed - No data to display  EKG   Radiology No results  found.  Procedures Procedures (including critical care time)  Medications Ordered in UC Medications - No data to display  Initial Impression / Assessment and Plan / UC Course  I have reviewed the triage vital signs and the nursing notes.  Pertinent labs & imaging results that were available during my care of the patient were reviewed by me and considered in my medical decision making (see chart for details).     Patient has multiple symptoms.  I think she would be better served in the emergency room.  With her history of craniotomy, worsening headache with visual symptoms I believe at the very least she needs a CT of her head.  Patient is here with a teenage son who can drive her to the emergency room.  She is stable for transfer. Final Clinical Impressions(s) / UC Diagnoses   Final diagnoses:  Bad headache  Changes in vision  Chest pain at rest  Bradycardia     Discharge Instructions      You need to go to the emergency room for additional testing that we cannot provide.  Make sure your son drive you directly to the [facility] emergency room 1.2 miles from here    ED Prescriptions   None    PDMP not reviewed this encounter.   Maranda Jamee Jacob, MD 12/26/23 1640

## 2023-12-26 NOTE — Discharge Instructions (Signed)
 You need to go to the emergency room for additional testing that we cannot provide.  Make sure your son drive you directly to the [facility] emergency room 1.2 miles from here

## 2023-12-26 NOTE — ED Triage Notes (Signed)
 Patient c/o HA x 3 days, cough and congestion.  Patient just finished a course of steroids and a Z-pack.  Afebrile.
